# Patient Record
Sex: Female | Born: 1985 | Hispanic: Yes | Marital: Single | Smoking: Former smoker
Health system: Southern US, Community
[De-identification: ages and names within clinical notes are randomized; demographics above are authoritative.]

## PROBLEM LIST (undated history)

## (undated) DIAGNOSIS — F419 Anxiety disorder, unspecified: Secondary | ICD-10-CM

## (undated) DIAGNOSIS — E119 Type 2 diabetes mellitus without complications: Secondary | ICD-10-CM

## (undated) DIAGNOSIS — F329 Major depressive disorder, single episode, unspecified: Secondary | ICD-10-CM

## (undated) DIAGNOSIS — N390 Urinary tract infection, site not specified: Secondary | ICD-10-CM

## (undated) DIAGNOSIS — N870 Mild cervical dysplasia: Secondary | ICD-10-CM

## (undated) DIAGNOSIS — R55 Syncope and collapse: Secondary | ICD-10-CM

## (undated) DIAGNOSIS — F32A Depression, unspecified: Secondary | ICD-10-CM

## (undated) HISTORY — PX: WISDOM TOOTH EXTRACTION: SHX21

## (undated) HISTORY — DX: Syncope and collapse: R55

## (undated) HISTORY — DX: Major depressive disorder, single episode, unspecified: F32.9

## (undated) HISTORY — DX: Depression, unspecified: F32.A

## (undated) HISTORY — DX: Anxiety disorder, unspecified: F41.9

## (undated) HISTORY — PX: NO PAST SURGERIES: SHX2092

## (undated) HISTORY — DX: Mild cervical dysplasia: N87.0

---

## 2011-05-10 ENCOUNTER — Ambulatory Visit (HOSPITAL_COMMUNITY)
Admission: RE | Admit: 2011-05-10 | Discharge: 2011-05-10 | Disposition: A | Discharge status: Home / Self Care | Payer: 59 | Attending: Psychiatry | Admitting: Psychiatry

## 2011-05-10 DIAGNOSIS — F321 Major depressive disorder, single episode, moderate: Secondary | ICD-10-CM | POA: Insufficient documentation

## 2011-05-15 ENCOUNTER — Other Ambulatory Visit (HOSPITAL_COMMUNITY): Payer: 59 | Admitting: Psychiatry

## 2011-05-16 ENCOUNTER — Other Ambulatory Visit (HOSPITAL_COMMUNITY): Payer: 59 | Attending: Psychiatry | Admitting: Psychiatry

## 2011-05-16 DIAGNOSIS — F321 Major depressive disorder, single episode, moderate: Secondary | ICD-10-CM | POA: Insufficient documentation

## 2011-05-16 DIAGNOSIS — R7309 Other abnormal glucose: Secondary | ICD-10-CM | POA: Insufficient documentation

## 2011-05-17 ENCOUNTER — Other Ambulatory Visit (HOSPITAL_COMMUNITY): Payer: 59 | Admitting: Psychiatry

## 2011-05-18 ENCOUNTER — Other Ambulatory Visit (HOSPITAL_COMMUNITY): Payer: 59 | Attending: Psychiatry | Admitting: Psychiatry

## 2011-05-18 DIAGNOSIS — F1220 Cannabis dependence, uncomplicated: Secondary | ICD-10-CM

## 2011-05-18 DIAGNOSIS — R7309 Other abnormal glucose: Secondary | ICD-10-CM | POA: Insufficient documentation

## 2011-05-18 DIAGNOSIS — F429 Obsessive-compulsive disorder, unspecified: Secondary | ICD-10-CM

## 2011-05-18 DIAGNOSIS — F321 Major depressive disorder, single episode, moderate: Secondary | ICD-10-CM | POA: Insufficient documentation

## 2011-05-18 DIAGNOSIS — F332 Major depressive disorder, recurrent severe without psychotic features: Secondary | ICD-10-CM

## 2011-05-19 ENCOUNTER — Other Ambulatory Visit (HOSPITAL_COMMUNITY): Payer: 59 | Admitting: Psychiatry

## 2011-05-22 ENCOUNTER — Other Ambulatory Visit (HOSPITAL_COMMUNITY): Payer: 59 | Admitting: Psychiatry

## 2011-05-23 ENCOUNTER — Other Ambulatory Visit (HOSPITAL_COMMUNITY): Payer: 59 | Admitting: Psychiatry

## 2011-05-24 ENCOUNTER — Other Ambulatory Visit (HOSPITAL_COMMUNITY): Payer: 59 | Admitting: Psychiatry

## 2011-05-25 ENCOUNTER — Other Ambulatory Visit (HOSPITAL_COMMUNITY): Payer: 59 | Attending: Psychiatry | Admitting: Psychiatry

## 2011-05-25 ENCOUNTER — Other Ambulatory Visit (HOSPITAL_COMMUNITY): Payer: 59 | Admitting: Psychiatry

## 2011-05-26 ENCOUNTER — Other Ambulatory Visit (HOSPITAL_COMMUNITY): Payer: 59 | Admitting: Psychiatry

## 2011-05-29 ENCOUNTER — Other Ambulatory Visit (HOSPITAL_COMMUNITY): Payer: 59 | Admitting: Psychiatry

## 2011-05-30 ENCOUNTER — Other Ambulatory Visit (HOSPITAL_COMMUNITY): Payer: 59 | Admitting: Psychiatry

## 2011-05-31 ENCOUNTER — Other Ambulatory Visit (HOSPITAL_COMMUNITY): Payer: 59 | Admitting: Psychiatry

## 2011-06-01 ENCOUNTER — Other Ambulatory Visit (HOSPITAL_COMMUNITY): Payer: 59 | Attending: Psychiatry | Admitting: Psychiatry

## 2011-06-01 DIAGNOSIS — F321 Major depressive disorder, single episode, moderate: Secondary | ICD-10-CM | POA: Insufficient documentation

## 2011-06-01 DIAGNOSIS — R7309 Other abnormal glucose: Secondary | ICD-10-CM | POA: Insufficient documentation

## 2011-06-02 ENCOUNTER — Other Ambulatory Visit (HOSPITAL_COMMUNITY): Payer: 59 | Admitting: Psychiatry

## 2011-06-05 ENCOUNTER — Other Ambulatory Visit (HOSPITAL_COMMUNITY): Payer: 59 | Attending: Psychiatry | Admitting: Psychiatry

## 2011-06-05 ENCOUNTER — Other Ambulatory Visit (HOSPITAL_COMMUNITY): Payer: 59 | Admitting: Psychiatry

## 2011-06-05 DIAGNOSIS — Z6379 Other stressful life events affecting family and household: Secondary | ICD-10-CM | POA: Insufficient documentation

## 2011-06-05 DIAGNOSIS — E669 Obesity, unspecified: Secondary | ICD-10-CM | POA: Insufficient documentation

## 2011-06-05 DIAGNOSIS — F429 Obsessive-compulsive disorder, unspecified: Secondary | ICD-10-CM | POA: Insufficient documentation

## 2011-06-05 DIAGNOSIS — F332 Major depressive disorder, recurrent severe without psychotic features: Secondary | ICD-10-CM | POA: Insufficient documentation

## 2011-06-05 DIAGNOSIS — R7309 Other abnormal glucose: Secondary | ICD-10-CM | POA: Insufficient documentation

## 2011-06-05 DIAGNOSIS — F1220 Cannabis dependence, uncomplicated: Secondary | ICD-10-CM | POA: Insufficient documentation

## 2011-06-06 ENCOUNTER — Other Ambulatory Visit (HOSPITAL_COMMUNITY): Payer: 59 | Attending: Psychiatry | Admitting: Psychiatry

## 2011-06-06 ENCOUNTER — Other Ambulatory Visit (HOSPITAL_COMMUNITY): Payer: 59 | Admitting: Psychiatry

## 2011-06-06 DIAGNOSIS — E669 Obesity, unspecified: Secondary | ICD-10-CM | POA: Insufficient documentation

## 2011-06-06 DIAGNOSIS — F429 Obsessive-compulsive disorder, unspecified: Secondary | ICD-10-CM | POA: Insufficient documentation

## 2011-06-06 DIAGNOSIS — R7309 Other abnormal glucose: Secondary | ICD-10-CM | POA: Insufficient documentation

## 2011-06-06 DIAGNOSIS — F1220 Cannabis dependence, uncomplicated: Secondary | ICD-10-CM | POA: Insufficient documentation

## 2011-06-06 DIAGNOSIS — F332 Major depressive disorder, recurrent severe without psychotic features: Secondary | ICD-10-CM | POA: Insufficient documentation

## 2011-06-08 ENCOUNTER — Other Ambulatory Visit (HOSPITAL_COMMUNITY): Payer: 59 | Attending: Psychiatry | Admitting: Psychiatry

## 2011-06-08 DIAGNOSIS — F429 Obsessive-compulsive disorder, unspecified: Secondary | ICD-10-CM | POA: Insufficient documentation

## 2011-06-08 DIAGNOSIS — F1220 Cannabis dependence, uncomplicated: Secondary | ICD-10-CM | POA: Insufficient documentation

## 2011-06-08 DIAGNOSIS — F332 Major depressive disorder, recurrent severe without psychotic features: Secondary | ICD-10-CM | POA: Insufficient documentation

## 2011-06-08 DIAGNOSIS — E669 Obesity, unspecified: Secondary | ICD-10-CM | POA: Insufficient documentation

## 2011-06-09 ENCOUNTER — Other Ambulatory Visit (HOSPITAL_COMMUNITY): Payer: 59 | Admitting: Psychiatry

## 2011-06-12 ENCOUNTER — Other Ambulatory Visit (HOSPITAL_COMMUNITY): Payer: 59 | Admitting: Psychiatry

## 2011-06-13 ENCOUNTER — Other Ambulatory Visit (HOSPITAL_COMMUNITY): Payer: 59 | Admitting: Psychiatry

## 2011-06-14 ENCOUNTER — Other Ambulatory Visit (HOSPITAL_COMMUNITY): Payer: 59 | Admitting: Psychiatry

## 2011-06-15 ENCOUNTER — Other Ambulatory Visit (HOSPITAL_COMMUNITY): Payer: 59 | Admitting: Psychiatry

## 2011-06-16 ENCOUNTER — Other Ambulatory Visit (HOSPITAL_COMMUNITY): Payer: 59 | Admitting: Psychiatry

## 2011-06-16 ENCOUNTER — Ambulatory Visit (HOSPITAL_COMMUNITY): Payer: 59 | Admitting: Psychology

## 2011-06-19 ENCOUNTER — Other Ambulatory Visit (HOSPITAL_COMMUNITY): Payer: 59 | Admitting: Psychiatry

## 2012-02-22 ENCOUNTER — Ambulatory Visit (INDEPENDENT_AMBULATORY_CARE_PROVIDER_SITE_OTHER): Payer: Self-pay | Admitting: Gynecology

## 2012-02-22 ENCOUNTER — Encounter: Payer: Self-pay | Admitting: Gynecology

## 2012-02-22 VITALS — BP 110/70 | Ht 63.0 in | Wt 150.0 lb

## 2012-02-22 DIAGNOSIS — Z309 Encounter for contraceptive management, unspecified: Secondary | ICD-10-CM

## 2012-02-22 DIAGNOSIS — Z30432 Encounter for removal of intrauterine contraceptive device: Secondary | ICD-10-CM

## 2012-02-22 DIAGNOSIS — R102 Pelvic and perineal pain: Secondary | ICD-10-CM

## 2012-02-22 DIAGNOSIS — N949 Unspecified condition associated with female genital organs and menstrual cycle: Secondary | ICD-10-CM

## 2012-02-22 NOTE — Progress Notes (Signed)
Pa half for tient is a 26 year old gravida 0 who presented to the office as a new patient today with a request to have her Mirena IUD removed. She stated that since her IUD was placed in another facility in 2011 she has constant left lower quadrant pain. Other facility had recommended over-the-counter nonsteroidals and it has not alleviated her discomfort. She also wanted to discuss other forms of contraception. She had been on the pill prior to the Mirena IUD but the reason for switching over was an issue of remembering to take a tablet every day. She denies any family history of any thrombophilia. She stated her last Pap smear was normal a year ago.  Exam: Bartholin urethra Skene was within normal limits Vagina: No lesions or discharge Cervix: IUD string seen Uterus: Slightly retroverted no palpable masses or tenderness Adnexa: No palpable masses or tenderness Rectal exam: Not done  As per patient's request the IUD was removed in a sterile fashion shown to the patient and discarded. We discussed different alternative forms of contraception and she is interested in proceeding with a Nuvaring and instructions and demonstration as well as a sample was provided. If she continues with the right lower quadrant discomfort she will return back to the office and we'll do an ultrasound and further evaluation. She'll return back in 6 months which is due for full annual GYN exam. New screening Pap smear guidelines were discussed.

## 2012-02-22 NOTE — Patient Instructions (Addendum)

## 2013-11-04 ENCOUNTER — Ambulatory Visit: Payer: Self-pay | Admitting: Family Medicine

## 2013-11-04 VITALS — BP 100/72 | HR 70 | Temp 98.7°F | Resp 16 | Ht 63.5 in | Wt 150.8 lb

## 2013-11-04 DIAGNOSIS — R350 Frequency of micturition: Secondary | ICD-10-CM

## 2013-11-04 DIAGNOSIS — R319 Hematuria, unspecified: Secondary | ICD-10-CM

## 2013-11-04 LAB — POCT UA - MICROSCOPIC ONLY
Casts, Ur, LPF, POC: NEGATIVE
Mucus, UA: POSITIVE
Yeast, UA: NEGATIVE

## 2013-11-04 LAB — POCT URINALYSIS DIPSTICK
Bilirubin, UA: NEGATIVE
Nitrite, UA: NEGATIVE
Spec Grav, UA: 1.025
pH, UA: 8

## 2013-11-04 MED ORDER — PHENAZOPYRIDINE HCL 200 MG PO TABS: 200.0000 mg | ORAL_TABLET | Freq: Three times a day (TID) | ORAL | Status: DC | PRN

## 2013-11-04 MED ORDER — CIPROFLOXACIN HCL 500 MG PO TABS: 500.0000 mg | ORAL_TABLET | Freq: Two times a day (BID) | ORAL | Status: DC

## 2013-11-04 NOTE — Patient Instructions (Signed)
Urinary Tract Infection  Urinary tract infections (UTIs) can develop anywhere along your urinary tract. Your urinary tract is your body's drainage system for removing wastes and extra water. Your urinary tract includes two kidneys, two ureters, a bladder, and a urethra. Your kidneys are a pair of bean-shaped organs. Each kidney is about the size of your fist. They are located below your ribs, one on each side of your spine.  CAUSES  Infections are caused by microbes, which are microscopic organisms, including fungi, viruses, and bacteria. These organisms are so small that they can only be seen through a microscope. Bacteria are the microbes that most commonly cause UTIs.  SYMPTOMS   Symptoms of UTIs may vary by age and gender of the patient and by the location of the infection. Symptoms in young women typically include a frequent and intense urge to urinate and a painful, burning feeling in the bladder or urethra during urination. Older women and men are more likely to be tired, shaky, and weak and have muscle aches and abdominal pain. A fever may mean the infection is in your kidneys. Other symptoms of a kidney infection include pain in your back or sides below the ribs, nausea, and vomiting.  DIAGNOSIS  To diagnose a UTI, your caregiver will ask you about your symptoms. Your caregiver also will ask to provide a urine sample. The urine sample will be tested for bacteria and white blood cells. White blood cells are made by your body to help fight infection.  TREATMENT   Typically, UTIs can be treated with medication. Because most UTIs are caused by a bacterial infection, they usually can be treated with the use of antibiotics. The choice of antibiotic and length of treatment depend on your symptoms and the type of bacteria causing your infection.  HOME CARE INSTRUCTIONS   If you were prescribed antibiotics, take them exactly as your caregiver instructs you. Finish the medication even if you feel better after you  have only taken some of the medication.   Drink enough water and fluids to keep your urine clear or pale yellow.   Avoid caffeine, tea, and carbonated beverages. They tend to irritate your bladder.   Empty your bladder often. Avoid holding urine for long periods of time.   Empty your bladder before and after sexual intercourse.   After a bowel movement, women should cleanse from front to back. Use each tissue only once.  SEEK MEDICAL CARE IF:    You have back pain.   You develop a fever.   Your symptoms do not begin to resolve within 3 days.  SEEK IMMEDIATE MEDICAL CARE IF:    You have severe back pain or lower abdominal pain.   You develop chills.   You have nausea or vomiting.   You have continued burning or discomfort with urination.  MAKE SURE YOU:    Understand these instructions.   Will watch your condition.   Will get help right away if you are not doing well or get worse.  Document Released: 08/30/2005 Document Revised: 05/21/2012 Document Reviewed: 12/29/2011  ExitCare Patient Information 2014 [employer].

## 2013-11-04 NOTE — Progress Notes (Signed)
Subjective:    Patient ID: Sherry Campbell, female    DOB: November 08, 1986, 27 y.o.   MRN: [id number] This chart was scribed for Sherry Sidle, MD by Clydene Laming, ED Scribe. This patient was seen in room 4 and the patient's care was started at 5:55 PM. HPI  HPI Comments: Baylea Milburn is a 27 y.o. female who presents to the [facility] complaining of hematuria with associated abdominal pain and back pain beginning  She reports urgency with a decreased amount with pink spots. Pt denies fever nausea vomiting. Pt denies a hx of kidney stones, kidney infection, and uti's. Pt denies being pregnant but states it could be possible. Her lmc was 11/9-11/14. 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     There are no active problems to display for this patient.  Past Medical History  Diagnosis Date   Anxiety    Depression    History reviewed. No pertinent past surgical history. No Known Allergies Prior to Admission medications   Not on File   History   Social History   Marital Status: Single    Spouse Name: N/A    Number of Children: N/A   Years of Education: N/A   Occupational History   Not on file.   Social History Main Topics   Smoking status: Never Smoker    Smokeless tobacco: Never Used   Alcohol Use: Yes     Comment: SOCIALLY   Drug Use: No   Sexual Activity: Yes    Birth Control/ Protection: IUD   Other Topics Concern   Not on file   Social History Narrative   No narrative on file     Review of Systems  Constitutional: Negative for fever and chills.  Gastrointestinal: Positive for abdominal pain. Negative for nausea and vomiting.  Genitourinary: Positive for urgency, hematuria and decreased urine volume.  Musculoskeletal: Positive for back pain.       Objective:   Physical Exam  Nursing note and vitals reviewed. Constitutional: She is oriented to person, place, and time. She appears  well-developed and well-nourished.  HENT:  Head: Normocephalic.  Eyes: EOM are normal.  Neck: Normal range of motion.  Pulmonary/Chest: Effort normal.  Musculoskeletal: Normal range of motion.  Neurological: She is alert and oriented to person, place, and time.  Psychiatric: She has a normal mood and affect.   Filed Vitals:   11/04/13 1741  BP: 100/72  Pulse: 70  Temp: 98.7 F (37.1 C)  TempSrc: Oral  Resp: 16  Height: 5' 3.5" (1.613 m)  Weight: 150 lb 12.8 oz (68.402 kg)  SpO2: 100%  lungs clear No CVAT Results for orders placed in visit on 11/04/13  POCT UA - MICROSCOPIC ONLY      Result Value Range   WBC, Ur, HPF, POC tntc     RBC, urine, microscopic tntc     Bacteria, U Microscopic 0-2     Mucus, UA pos     Epithelial cells, urine per micros 0-2     Crystals, Ur, HPF, POC neg     Casts, Ur, LPF, POC neg     Yeast, UA neg    POCT URINALYSIS DIPSTICK      Result Value Range   Color, UA yellow     Clarity, UA clear     Glucose, UA neg     Bilirubin, UA neg     Ketones, UA  neg     Spec Grav, UA 1.025     Blood, UA large     pH, UA 8.0     Protein, UA 100     Urobilinogen, UA 0.2     Nitrite, UA neg     Leukocytes, UA small (1+)    POCT URINE PREGNANCY      Result Value Range   Preg Test, Ur Negative           Assessment & Plan:  5:58 PM- Discussed treatment plan with pt at bedside. Pt verbalized understanding and agreement with plan.  I personally performed the services described in this documentation, which was scribed in my presence. The recorded information has been reviewed and is accurate. Hematuria - Plan: POCT UA - Microscopic Only, POCT urinalysis dipstick, POCT urine pregnancy, ciprofloxacin (CIPRO) 500 MG tablet, phenazopyridine (PYRIDIUM) 200 MG tablet  Frequent urination - Plan: POCT UA - Microscopic Only, POCT urinalysis dipstick, POCT urine pregnancy, ciprofloxacin (CIPRO) 500 MG tablet, phenazopyridine (PYRIDIUM) 200 MG  tablet  Signed, Sherry Sidle, MD

## 2013-11-05 ENCOUNTER — Telehealth: Payer: Self-pay

## 2013-11-05 NOTE — Telephone Encounter (Signed)
Patient states that her Ciprofloxacin and Pyridium are making her dizzy. Is this normal?  [phone]

## 2013-11-06 MED ORDER — NITROFURANTOIN MONOHYD MACRO 100 MG PO CAPS: 100.0000 mg | ORAL_CAPSULE | Freq: Two times a day (BID) | ORAL | Status: DC

## 2013-11-06 NOTE — Telephone Encounter (Signed)
Called her to advise she does want to change this, she will d/c the meds, she notes she has improved slightly

## 2013-11-06 NOTE — Telephone Encounter (Signed)
Called and spoke with patient in regards to new med being sent in.

## 2013-11-06 NOTE — Telephone Encounter (Signed)
Please advise 

## 2013-11-06 NOTE — Telephone Encounter (Signed)
Cipro can cause dizziness.  If she cannot tolerate this, we can change her to a different antibiotic

## 2013-11-06 NOTE — Telephone Encounter (Signed)
Sent macrobid to pharmacy.  Finish full course

## 2014-12-29 ENCOUNTER — Ambulatory Visit: Payer: Self-pay | Admitting: Family Medicine

## 2014-12-31 ENCOUNTER — Ambulatory Visit (INDEPENDENT_AMBULATORY_CARE_PROVIDER_SITE_OTHER): Payer: 59 | Admitting: Emergency Medicine

## 2014-12-31 VITALS — BP 118/70 | HR 84 | Temp 98.9°F | Resp 18 | Ht 63.5 in | Wt 159.0 lb

## 2014-12-31 DIAGNOSIS — B9689 Other specified bacterial agents as the cause of diseases classified elsewhere: Secondary | ICD-10-CM

## 2014-12-31 DIAGNOSIS — A5901 Trichomonal vulvovaginitis: Secondary | ICD-10-CM

## 2014-12-31 DIAGNOSIS — N912 Amenorrhea, unspecified: Secondary | ICD-10-CM

## 2014-12-31 DIAGNOSIS — A499 Bacterial infection, unspecified: Secondary | ICD-10-CM

## 2014-12-31 DIAGNOSIS — N760 Acute vaginitis: Secondary | ICD-10-CM

## 2014-12-31 DIAGNOSIS — R109 Unspecified abdominal pain: Secondary | ICD-10-CM

## 2014-12-31 LAB — POCT WET PREP WITH KOH
KOH Prep POC: NEGATIVE
Trichomonas, UA: POSITIVE
YEAST WET PREP PER HPF POC: NEGATIVE

## 2014-12-31 LAB — POCT URINALYSIS DIPSTICK
Bilirubin, UA: NEGATIVE
Blood, UA: NEGATIVE
GLUCOSE UA: NEGATIVE
KETONES UA: NEGATIVE
NITRITE UA: NEGATIVE
PH UA: 6.5
Protein, UA: NEGATIVE
Spec Grav, UA: 1.025
Urobilinogen, UA: 0.2

## 2014-12-31 LAB — POCT UA - MICROSCOPIC ONLY
CASTS, UR, LPF, POC: NEGATIVE
CRYSTALS, UR, HPF, POC: NEGATIVE
MUCUS UA: POSITIVE
Trichomonas: POSITIVE
Yeast, UA: NEGATIVE

## 2014-12-31 LAB — POCT URINE PREGNANCY: PREG TEST UR: NEGATIVE

## 2014-12-31 MED ORDER — METRONIDAZOLE 500 MG PO TABS: 500.0000 mg | ORAL_TABLET | Freq: Two times a day (BID) | ORAL | Status: DC

## 2014-12-31 NOTE — Patient Instructions (Signed)
Bacterial Vaginosis °Bacterial vaginosis is a vaginal infection that occurs when the normal balance of bacteria in the vagina is disrupted. It results from an overgrowth of certain bacteria. This is the most common vaginal infection in women of childbearing age. Treatment is important to prevent complications, especially in pregnant women, as it can cause a premature delivery. °CAUSES  °Bacterial vaginosis is caused by an increase in harmful bacteria that are normally present in smaller amounts in the vagina. Several different kinds of bacteria can cause bacterial vaginosis. However, the reason that the condition develops is not fully understood. °RISK FACTORS °Certain activities or behaviors can put you at an increased risk of developing bacterial vaginosis, including: °· Having a new sex partner or multiple sex partners. °· Douching. °· Using an intrauterine device (IUD) for contraception. °Women do not get bacterial vaginosis from toilet seats, bedding, swimming pools, or contact with objects around them. °SIGNS AND SYMPTOMS  °Some women with bacterial vaginosis have no signs or symptoms. Common symptoms include: °· Grey vaginal discharge. °· A fishlike odor with discharge, especially after sexual intercourse. °· Itching or burning of the vagina and vulva. °· Burning or pain with urination. °DIAGNOSIS  °Your health care provider will take a medical history and examine the vagina for signs of bacterial vaginosis. A sample of vaginal fluid may be taken. Your health care provider will look at this sample under a microscope to check for bacteria and abnormal cells. A vaginal pH test may also be done.  °TREATMENT  °Bacterial vaginosis may be treated with antibiotic medicines. These may be given in the form of a pill or a vaginal cream. A second round of antibiotics may be prescribed if the condition comes back after treatment.  °HOME CARE INSTRUCTIONS  °· Only take over-the-counter or prescription medicines as  directed by your health care provider. °· If antibiotic medicine was prescribed, take it as directed. Make sure you finish it even if you start to feel better. °· Do not have sex until treatment is completed. °· Tell all sexual partners that you have a vaginal infection. They should see their health care provider and be treated if they have problems, such as a mild rash or itching. °· Practice safe sex by using condoms and only having one sex partner. °SEEK MEDICAL CARE IF:  °· Your symptoms are not improving after 3 days of treatment. °· You have increased discharge or pain. °· You have a fever. °MAKE SURE YOU:  °· Understand these instructions. °· Will watch your condition. °· Will get help right away if you are not doing well or get worse. °FOR MORE INFORMATION  °Centers for Disease Control and Prevention, Division of STD Prevention: www.cdc.gov/std °American Sexual Health Association (ASHA): www.ashastd.org  °Document Released: 11/20/2005 Document Revised: 09/10/2013 Document Reviewed: 07/02/2013 °ExitCare® Patient Information ©2015 [employer]. This information is not intended to replace advice given to you by your health care provider. Make sure you discuss any questions you have with your health care provider. °Trichomoniasis °Trichomoniasis is an infection caused by an organism called Trichomonas. The infection can affect both women and men. In women, the outer female genitalia and the vagina are affected. In men, the penis is mainly affected, but the prostate and other reproductive organs can also be involved. Trichomoniasis is a sexually transmitted infection (STI) and is most often passed to another person through sexual contact.  °RISK FACTORS °· Having unprotected sexual intercourse. °· Having sexual intercourse with an infected partner. °SIGNS AND SYMPTOMS  °  Symptoms of trichomoniasis in women include: °· Abnormal gray-green frothy vaginal discharge. °· Itching and irritation of the  vagina. °· Itching and irritation of the area outside the vagina. °Symptoms of trichomoniasis in men include:  °· Penile discharge with or without pain. °· Pain during urination. This results from inflammation of the urethra. °DIAGNOSIS  °Trichomoniasis may be found during a Pap test or physical exam. Your health care provider may use one of the following methods to help diagnose this infection: °· Examining vaginal discharge under a microscope. For men, urethral discharge would be examined. °· Testing the pH of the vagina with a test tape. °· Using a vaginal swab test that checks for the Trichomonas organism. A test is available that provides results within a few minutes. °· Doing a culture test for the organism. This is not usually needed. °TREATMENT  °· You may be given medicine to fight the infection. Women should inform their health care provider if they could be or are pregnant. Some medicines used to treat the infection should not be taken during pregnancy. °· Your health care provider may recommend over-the-counter medicines or creams to decrease itching or irritation. °· Your sexual partner will need to be treated if infected. °HOME CARE INSTRUCTIONS  °· Take medicines only as directed by your health care provider. °· Take over-the-counter medicine for itching or irritation as directed by your health care provider. °· Do not have sexual intercourse while you have the infection. °· Women should not douche or wear tampons while they have the infection. °· Discuss your infection with your partner. Your partner may have gotten the infection from you, or you may have gotten it from your partner. °· Have your sex partner get examined and treated if necessary. °· Practice safe, informed, and protected sex. °· See your health care provider for other STI testing. °SEEK MEDICAL CARE IF:  °· You still have symptoms after you finish your medicine. °· You develop abdominal pain. °· You have pain when you urinate. °· You  have bleeding after sexual intercourse. °· You develop a rash. °· Your medicine makes you sick or makes you throw up (vomit). °MAKE SURE YOU: °· Understand these instructions. °· Will watch your condition. °· Will get help right away if you are not doing well or get worse. °Document Released: 05/16/2001 Document Revised: 04/06/2014 Document Reviewed: 09/01/2013 °ExitCare® Patient Information ©2015 [employer]. This information is not intended to replace advice given to you by your health care provider. Make sure you discuss any questions you have with your health care provider. ° °

## 2014-12-31 NOTE — Progress Notes (Signed)
[facility] [address] [place] [address] [phone]  Date:  12/31/2014   Name:  Sherry Campbell   DOB:  06/15/1986   MRN:  [id number]  PCP:  No primary care provider on file.    Chief Complaint: Flank Pain; late mense; and dm check   History of Present Illness:  Sherry Campbell is a 29 y.o. very pleasant female patient who presents with the following:  Last menses christmas.  Missed January.  Sexually active with no BC Negative HPT x1 Has one week duration left groin pain.  Pain is worse with walking but has diminished. No dysuria urgency or frequency.  No nausea or vomiting. No nausea or vomiting or diarrhea.  No stool change No fever or chills. No improvement with over the counter medications or other home remedies.  Denies other complaint or health concern today.    There are no active problems to display for this patient.   Past Medical History  Diagnosis Date  . Anxiety   . Depression     History reviewed. No pertinent past surgical history.  History  Substance Use Topics  . Smoking status: Never Smoker   . Smokeless tobacco: Never Used  . Alcohol Use: Yes     Comment: SOCIALLY    Family History  Problem Relation Age of Onset  . Diabetes Mother   . Hypertension Mother   . Diabetes Father   . Diabetes Maternal Grandmother   . Diabetes Maternal Grandfather   . Heart disease Maternal Grandfather   . Diabetes Paternal Grandmother   . Diabetes Paternal Grandfather     No Known Allergies  Medication list has been reviewed and updated.  Current Outpatient Prescriptions on File Prior to Visit  Medication Sig Dispense Refill  . nitrofurantoin, macrocrystal-monohydrate, (MACROBID) 100 MG capsule Take 1 capsule (100 mg total) by mouth 2 (two) times daily. (Patient not taking: Reported on 12/31/2014) 10 capsule 0  . phenazopyridine (PYRIDIUM) 200 MG tablet Take 1 tablet (200 mg total) by mouth 3 (three) times daily as needed for pain.  (Patient not taking: Reported on 12/31/2014) 10 tablet 0   No current facility-administered medications on file prior to visit.    Review of Systems:  As per HPI, otherwise negative.    Physical Examination: Filed Vitals:   12/31/14 0858  BP: 118/70  Pulse: 84  Temp: 98.9 F (37.2 C)  Resp: 18   Filed Vitals:   12/31/14 0858  Height: 5' 3.5" (1.613 m)  Weight: 159 lb (72.122 kg)   Body mass index is 27.72 kg/(m^2). Ideal Body Weight: Weight in (lb) to have BMI = 25: 143.1   GEN: WDWN, NAD, Non-toxic, Alert & Oriented x 3 HEENT: Atraumatic, Normocephalic.  Ears and Nose: No external deformity. EXTR: No clubbing/cyanosis/edema NEURO: Normal gait.  PSYCH: Normally interactive. Conversant. Not depressed or anxious appearing.  Calm demeanor.  Abd:  Mild LLQ tenderness.  No rebound or guarding.  No CVA tenderness  Assessment and Plan: BV Trich Flagyl Amenorrhea  Signed,  Phillips Odor, MD    Results for orders placed or performed in visit on 12/31/14  POCT urine pregnancy  Result Value Ref Range   Preg Test, Ur Negative   POCT urinalysis dipstick  Result Value Ref Range   Color, UA dk yellow    Clarity, UA cloudy    Glucose, UA neg    Bilirubin, UA neg    Ketones, UA neg    Spec Grav, UA  1.025    Blood, UA neg    pH, UA 6.5    Protein, UA neg    Urobilinogen, UA 0.2    Nitrite, UA neg    Leukocytes, UA Trace   POCT UA - Microscopic Only  Result Value Ref Range   WBC, Ur, HPF, POC 1-5    RBC, urine, microscopic 0-2    Bacteria, U Microscopic 4+    Mucus, UA pos    Epithelial cells, urine per micros 0-6    Crystals, Ur, HPF, POC neg    Casts, Ur, LPF, POC neg    Yeast, UA neg    Trichomonas pos   POCT Wet Prep with KOH  Result Value Ref Range   Trichomonas, UA Positive    Clue Cells Wet Prep HPF POC 1-4    Epithelial Wet Prep HPF POC 1-6    Yeast Wet Prep HPF POC neg    Bacteria Wet Prep HPF POC 3+    RBC Wet Prep HPF POC 0-1    WBC Wet  Prep HPF POC 1-7    KOH Prep POC Negative

## 2015-01-02 ENCOUNTER — Telehealth: Payer: Self-pay

## 2015-01-02 NOTE — Telephone Encounter (Signed)
PATIENT STATES SHE SAW DR. Stevan BornANDERSON THURS. FOR A VAGINAL INFECTION. HE PRESCRIBED HER METRONIDAZOLE 500 MG. WHEN SHE PICKED IT UP AT THE PHARMACY TODAY, SHE THOUGHT HE WAS GOING TO GIVE HER 2 MEDICATIONS, BUT SHE ONLY HAD ONE. SHE WANTS TO MAKE SURE THIS IS CORRECT? BEST PHONE [phone] (CELL)  PHARMACY CHOICE IS [facility] ON [address]   MBC

## 2015-01-04 NOTE — Telephone Encounter (Signed)
Spoke with pt, she was diagnosed with BV and Trich but I advised her Flagyl is used to treat both. She did not need any other medication. Pt understood.

## 2015-01-11 ENCOUNTER — Telehealth: Payer: Self-pay

## 2015-01-11 NOTE — Telephone Encounter (Signed)
Spoke with pt, advised she should wait a week until she can resume sex. Pt understood.

## 2015-01-11 NOTE — Telephone Encounter (Signed)
Pt is taking metroNIDAZOLE (FLAGYL) 500 MG tablet [[id number]][[id number]] , her last pill is tonight. She would like to know when she can resume her lifestyle. [phone]

## 2015-01-22 ENCOUNTER — Ambulatory Visit (INDEPENDENT_AMBULATORY_CARE_PROVIDER_SITE_OTHER): Payer: 59 | Admitting: Women's Health

## 2015-01-22 ENCOUNTER — Other Ambulatory Visit (HOSPITAL_COMMUNITY)
Admission: RE | Admit: 2015-01-22 | Discharge: 2015-01-22 | Disposition: A | Discharge status: Home / Self Care | Payer: 59 | Source: Ambulatory Visit | Attending: Gynecology | Admitting: Gynecology

## 2015-01-22 ENCOUNTER — Encounter: Payer: Self-pay | Admitting: Women's Health

## 2015-01-22 VITALS — BP 120/80 | Ht 62.0 in | Wt 161.0 lb

## 2015-01-22 DIAGNOSIS — Z01411 Encounter for gynecological examination (general) (routine) with abnormal findings: Secondary | ICD-10-CM | POA: Diagnosis not present

## 2015-01-22 DIAGNOSIS — N898 Other specified noninflammatory disorders of vagina: Secondary | ICD-10-CM

## 2015-01-22 DIAGNOSIS — N760 Acute vaginitis: Secondary | ICD-10-CM

## 2015-01-22 DIAGNOSIS — A499 Bacterial infection, unspecified: Secondary | ICD-10-CM

## 2015-01-22 DIAGNOSIS — Z01419 Encounter for gynecological examination (general) (routine) without abnormal findings: Secondary | ICD-10-CM

## 2015-01-22 DIAGNOSIS — B9689 Other specified bacterial agents as the cause of diseases classified elsewhere: Secondary | ICD-10-CM

## 2015-01-22 DIAGNOSIS — Z13220 Encounter for screening for lipoid disorders: Secondary | ICD-10-CM

## 2015-01-22 DIAGNOSIS — Z113 Encounter for screening for infections with a predominantly sexual mode of transmission: Secondary | ICD-10-CM

## 2015-01-22 DIAGNOSIS — Z833 Family history of diabetes mellitus: Secondary | ICD-10-CM

## 2015-01-22 LAB — CBC WITH DIFFERENTIAL/PLATELET
Basophils Absolute: 0 10*3/uL (ref 0.0–0.1)
Basophils Relative: 0 % (ref 0–1)
EOS PCT: 2 % (ref 0–5)
Eosinophils Absolute: 0.1 10*3/uL (ref 0.0–0.7)
HCT: 36.4 % (ref 36.0–46.0)
HEMOGLOBIN: 12.3 g/dL (ref 12.0–15.0)
LYMPHS PCT: 38 % (ref 12–46)
Lymphs Abs: 2.3 10*3/uL (ref 0.7–4.0)
MCH: 29.6 pg (ref 26.0–34.0)
MCHC: 33.8 g/dL (ref 30.0–36.0)
MCV: 87.7 fL (ref 78.0–100.0)
MPV: 9.5 fL (ref 8.6–12.4)
Monocytes Absolute: 0.4 10*3/uL (ref 0.1–1.0)
Monocytes Relative: 6 % (ref 3–12)
Neutro Abs: 3.2 10*3/uL (ref 1.7–7.7)
Neutrophils Relative %: 54 % (ref 43–77)
Platelets: 295 10*3/uL (ref 150–400)
RBC: 4.15 MIL/uL (ref 3.87–5.11)
RDW: 13.1 % (ref 11.5–15.5)
WBC: 6 10*3/uL (ref 4.0–10.5)

## 2015-01-22 LAB — LIPID PANEL
Cholesterol: 114 mg/dL (ref 0–200)
HDL: 29 mg/dL — AB (ref >39)
LDL CALC: 70 mg/dL (ref 0–99)
Total CHOL/HDL Ratio: 3.9 Ratio
Triglycerides: 76 mg/dL (ref <150)
VLDL: 15 mg/dL (ref 0–40)

## 2015-01-22 LAB — GLUCOSE, RANDOM: GLUCOSE: 82 mg/dL (ref 70–99)

## 2015-01-22 LAB — WET PREP FOR TRICH, YEAST, CLUE
Trich, Wet Prep: NONE SEEN
Yeast Wet Prep HPF POC: NONE SEEN

## 2015-01-22 NOTE — Patient Instructions (Signed)

## 2015-01-22 NOTE — Progress Notes (Signed)
Sherry AlfredBeatriz Campbell [birth year] [id number]    History:    Presents for annual exam.  Regular monthly cycle diagnosed with trichomonas last month at urgent care. Did not have  STD screening. Reports normal Pap in the past. Did not receive gardasil.  Past medical history, past surgical history, family history and social history were all reviewed and documented in the EPIC chart. Works at a [employer]. Numerous family members with diabetes. Mother on dialysis from complications of diabetes.  ROS:  A ROS was performed and pertinent positives and negatives are included.  Exam:  Filed Vitals:   01/22/15 1200  BP: 120/80    General appearance:  Normal Thyroid:  Symmetrical, normal in size, without palpable masses or nodularity. Respiratory  Auscultation:  Clear without wheezing or rhonchi Cardiovascular  Auscultation:  Regular rate, without rubs, murmurs or gallops  Edema/varicosities:  Not grossly evident Abdominal  Soft,nontender, without masses, guarding or rebound.  Liver/spleen:  No organomegaly noted  Hernia:  None appreciated  Skin  Inspection:  Grossly normal   Breasts: Examined lying and sitting.     Right: Without masses, retractions, discharge or axillary adenopathy.     Left: Without masses, retractions, discharge or axillary adenopathy. Gentitourinary   Inguinal/mons:  Normal without inguinal adenopathy  External genitalia:  Normal  BUS/Urethra/Skene's glands:  Normal  Vagina:  Normal wet prep negative.  Cervix:  Normal  Uterus:   normal in size, shape and contour.  Midline and mobile  Adnexa/parametria:     Rt: Without masses or tenderness.   Lt: Without masses or tenderness.  Anus and perineum: Normal  Digital rectal exam: Normal sphincter tone without palpated masses or tenderness  Assessment/Plan:  29 y.o. SHF G0 for annual exam.     Regular monthly cycle Contraception management STD screen  Plan: Contraception options reviewed and declines pregnancy  okay. Reviewed importance of MVI daily, safe pregnancy behaviors reviewed. Return to office with missed cycle for viability ultrasound reviewed we no longer deliver. CBC, glucose, lipid panel, UA, Pap, GC/Chlamydia, HIV, hep B, C, RPR. Reviewed importance of maintaining weight or weight loss, regular exercise, low carb diet for prevention of diabetes.   Harrington ChallengerYOUNG,Cloris Flippo J WHNP, 1:01 PM 01/22/2015

## 2015-01-23 LAB — GC/CHLAMYDIA PROBE AMP
CT Probe RNA: NEGATIVE
GC Probe RNA: NEGATIVE

## 2015-01-23 LAB — HEPATITIS B SURFACE ANTIGEN: Hepatitis B Surface Ag: NEGATIVE

## 2015-01-23 LAB — HEPATITIS C ANTIBODY: HCV AB: NEGATIVE

## 2015-01-23 LAB — RPR

## 2015-01-23 LAB — HIV ANTIBODY (ROUTINE TESTING W REFLEX): HIV 1&2 Ab, 4th Generation: NONREACTIVE

## 2015-01-25 ENCOUNTER — Encounter: Payer: Self-pay | Admitting: Women's Health

## 2015-01-25 NOTE — Telephone Encounter (Signed)
Sherry Campbell, Her wet prep did show rare clue cells but I do not see that you treated her. Was not sure how to explain to patient.

## 2015-01-26 LAB — CYTOLOGY - PAP

## 2015-02-18 ENCOUNTER — Ambulatory Visit: Payer: 59 | Admitting: Gynecology

## 2015-03-05 ENCOUNTER — Ambulatory Visit (INDEPENDENT_AMBULATORY_CARE_PROVIDER_SITE_OTHER): Payer: 59 | Admitting: Gynecology

## 2015-03-05 ENCOUNTER — Encounter: Payer: Self-pay | Admitting: Gynecology

## 2015-03-05 VITALS — BP 120/84 | Ht 62.0 in | Wt 165.0 lb

## 2015-03-05 DIAGNOSIS — N870 Mild cervical dysplasia: Secondary | ICD-10-CM | POA: Diagnosis not present

## 2015-03-05 NOTE — Progress Notes (Addendum)
   Patient is a 29 year old gravida 0 who presented to the office today for further evaluation as a result of her recent Pap smear at time of her annual exam which demonstrated following:  Diagnosis LOW GRADE SQUAMOUS INTRAEPITHELIAL LESION: CIN-1/ HPV (LSIL).  Patient stated that several years ago she had some form of abnormal Pap smear but was followed up with no biopsy or treatment and it appeared to have cleared up. We have no documentation.    Patient currently not using any form of contraception. Her last menstrual. Was 2 weeks ago. She is here for colposcopic evaluation.  Patient underwent detail colposcopic evaluation external genitalia, perineum, perirectal region with no lesions seen. The speculum was then introduced into the vagina. Systematic inspection of the entire vaginal walls as well as the fornix and cervix was undertaken. An acetowhite flat area was noted between 12 and 2:00 position with no atypical vessels or any cobblestoning effect. The use of the endocervical speculum demonstrated that the transformation zone was completely visualized. 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 Colposcopic directed biopsy of ectocervical lesion as noted above we will await pathology report and manage her according to the new guidelines.. I have explained to the patient the new ASCCP guidelines and if the Pap smear coincides with her pathology that the recommendation would be to repeat a Pap smear with HPV screen in 12 months. Patient would like to hold off on attempting to get pregnant until later in the year and will return back when her menses start for the first of series of 3 injections for the HPV vaccine. She is fully aware that she  will need to use condoms during that time. Literature information was provided.

## 2015-03-05 NOTE — Addendum Note (Signed)
Addended by: Kem ParkinsonBARNES, Kimya Mccahill on: 03/05/2015 09:49 AM   Modules accepted: Orders

## 2015-03-05 NOTE — Patient Instructions (Signed)
Colposcopy  Care After  Colposcopy is a procedure in which a special tool is used to magnify the surface of the cervix. A tissue sample (biopsy) may also be taken. This sample will be looked at for cervical cancer or other problems. After the test:  · You may have some cramping.  · Lie down for a few minutes if you feel lightheaded.  ·  You may have some bleeding which should stop in a few days.  HOME CARE  · Do not have sex or use tampons for 2 to 3 days or as told.  · Only take medicine as told by your doctor.  · Continue to take your birth control pills as usual.  Finding out the results of your test  Ask when your test results will be ready. Make sure you get your test results.  GET HELP RIGHT AWAY IF:  · You are bleeding a lot or are passing blood clots.  · You develop a fever of 102° F (38.9° C) or higher.  · You have abnormal vaginal discharge.  · You have cramps that do not go away with medicine.  · You feel lightheaded, dizzy, or pass out (faint).  MAKE SURE YOU:   · Understand these instructions.  · Will watch your condition.  · Will get help right away if you are not doing well or get worse.  Document Released: 05/08/2008 Document Revised: 02/12/2012 Document Reviewed: 06/19/2013  ExitCare® Patient Information ©2015 [employer]. This information is not intended to replace advice given to you by your health care provider. Make sure you discuss any questions you have with your health care provider.  Colposcopy  Colposcopy is a procedure to examine your cervix and vagina, or the area around the outside of your vagina, for abnormalities or signs of disease. The procedure is done using a lighted microscope called a colposcope. Tissue samples may be collected during the colposcopy if your health care provider finds any unusual cells. A colposcopy may be done if a woman has:  · An abnormal Pap test. A Pap test is a medical test done to evaluate cells that are on the surface of the cervix.  · A Pap test result  that is suggestive of human papillomavirus (HPV). This virus can cause genital warts and is linked to the development of cervical cancer.  · A sore on her cervix and the results of a Pap test were normal.  · Genital warts on the cervix or in or around the outside of the vagina.  · A mother who took the drug diethylstilbestrol (DES) while pregnant.  · Painful intercourse.  · Vaginal bleeding, especially after sexual intercourse.  LET YOUR HEALTH CARE PROVIDER KNOW ABOUT:  · Any allergies you have.  · All medicines you are taking, including vitamins, herbs, eye drops, creams, and over-the-counter medicines.  · Previous problems you or members of your family have had with the use of anesthetics.  · Any blood disorders you have.  · Previous surgeries you have had.  · Medical conditions you have.  RISKS AND COMPLICATIONS  Generally, a colposcopy is a safe procedure. However, as with any procedure, complications can occur. Possible complications include:  · Bleeding.  · Infection.  · Missed lesions.  BEFORE THE PROCEDURE   · Tell your health care provider if you have your menstrual period. A colposcopy typically is not done during menstruation.  · For 24 hours before the colposcopy, do not:  ¨ Douche.  ¨ Use   tampons.  ¨ Use medicines, creams, or suppositories in the vagina.  ¨ Have sexual intercourse.  PROCEDURE   During the procedure, you will be lying on your back with your feet in foot rests (stirrups). A warm metal or plastic instrument (speculum) will be placed in your vagina to keep it open and to allow the health care provider to see the cervix. The colposcope will be placed outside the vagina. It will be used to magnify and examine the cervix, vagina, and the area around the outside of the vagina. A small amount of liquid solution will be placed on the area that is to be viewed. This solution will make it easier to see the abnormal cells. Your health care provider will use tools to suck out mucus and cells from  the canal of the cervix. Then he or she will record the location of the abnormal areas.  If a biopsy is done during the procedure, a medicine will usually be given to numb the area (local anesthetic). You may feel mild pain or cramping while the biopsy is done. After the procedure, tissue samples collected during the biopsy will be sent to a lab for analysis.  AFTER THE PROCEDURE   You will be given instructions on when to follow up with your health care provider for your test results. It is important to keep your appointment.  Document Released: 02/10/2003 Document Revised: 07/23/2013 Document Reviewed: 06/19/2013  ExitCare® Patient Information ©2015 [employer]. This information is not intended to replace advice given to you by your health care provider. Make sure you discuss any questions you have with your health care provider.

## 2015-04-12 ENCOUNTER — Ambulatory Visit (INDEPENDENT_AMBULATORY_CARE_PROVIDER_SITE_OTHER): Payer: 59 | Admitting: Physician Assistant

## 2015-04-12 VITALS — BP 116/66 | HR 111 | Temp 98.2°F | Resp 18 | Ht 63.5 in | Wt 171.0 lb

## 2015-04-12 DIAGNOSIS — Z23 Encounter for immunization: Secondary | ICD-10-CM

## 2015-04-12 DIAGNOSIS — Z111 Encounter for screening for respiratory tuberculosis: Secondary | ICD-10-CM | POA: Diagnosis not present

## 2015-04-12 DIAGNOSIS — Z0289 Encounter for other administrative examinations: Secondary | ICD-10-CM | POA: Diagnosis not present

## 2015-04-12 DIAGNOSIS — Z3009 Encounter for other general counseling and advice on contraception: Secondary | ICD-10-CM

## 2015-04-12 DIAGNOSIS — R55 Syncope and collapse: Secondary | ICD-10-CM | POA: Diagnosis not present

## 2015-04-12 DIAGNOSIS — Z309 Encounter for contraceptive management, unspecified: Secondary | ICD-10-CM | POA: Diagnosis not present

## 2015-04-12 LAB — POCT CBC
GRANULOCYTE PERCENT: 60.9 % (ref 37–80)
HCT, POC: 42.4 % (ref 37.7–47.9)
Hemoglobin: 14 g/dL (ref 12.2–16.2)
Lymph, poc: 2.6 (ref 0.6–3.4)
MCH, POC: 28.7 pg (ref 27–31.2)
MCHC: 32.9 g/dL (ref 31.8–35.4)
MCV: 87.2 fL (ref 80–97)
MID (CBC): 0.2 (ref 0–0.9)
MPV: 7.7 fL (ref 0–99.8)
POC GRANULOCYTE: 4.4 (ref 2–6.9)
POC LYMPH %: 36.4 % (ref 10–50)
POC MID %: 2.7 %M (ref 0–12)
Platelet Count, POC: 243 10*3/uL (ref 142–424)
RBC: 4.87 M/uL (ref 4.04–5.48)
RDW, POC: 12.9 %
WBC: 7.2 10*3/uL (ref 4.6–10.2)

## 2015-04-12 LAB — POCT URINALYSIS DIPSTICK
BILIRUBIN UA: NEGATIVE
Glucose, UA: NEGATIVE
Ketones, UA: NEGATIVE
NITRITE UA: NEGATIVE
PH UA: 5.5
Protein, UA: NEGATIVE
Spec Grav, UA: 1.025
UROBILINOGEN UA: 0.2

## 2015-04-12 LAB — COMPLETE METABOLIC PANEL WITH GFR
ALT: 15 U/L (ref 0–35)
AST: 18 U/L (ref 0–37)
Albumin: 4 g/dL (ref 3.5–5.2)
Alkaline Phosphatase: 55 U/L (ref 39–117)
BILIRUBIN TOTAL: 0.7 mg/dL (ref 0.2–1.2)
BUN: 12 mg/dL (ref 6–23)
CALCIUM: 9.1 mg/dL (ref 8.4–10.5)
CHLORIDE: 102 meq/L (ref 96–112)
CO2: 25 mEq/L (ref 19–32)
Creat: 0.69 mg/dL (ref 0.50–1.10)
GFR, Est African American: >89 mL/min
GFR, Est Non African American: >89 mL/min
GLUCOSE: 86 mg/dL (ref 70–99)
Potassium: 3.8 mEq/L (ref 3.5–5.3)
Sodium: 136 mEq/L (ref 135–145)
TOTAL PROTEIN: 6.9 g/dL (ref 6.0–8.3)

## 2015-04-12 LAB — POCT UA - MICROSCOPIC ONLY
CASTS, UR, LPF, POC: NEGATIVE
CRYSTALS, UR, HPF, POC: NEGATIVE
Mucus, UA: NEGATIVE
RBC, urine, microscopic: NEGATIVE
Yeast, UA: NEGATIVE

## 2015-04-12 LAB — POCT URINE PREGNANCY: Preg Test, Ur: NEGATIVE

## 2015-04-12 MED ORDER — NORGESTIM-ETH ESTRAD TRIPHASIC 0.18/0.215/0.25 MG-35 MCG PO TABS: 1.0000 | ORAL_TABLET | Freq: Every day | ORAL | Status: DC

## 2015-04-12 NOTE — Progress Notes (Signed)

## 2015-04-12 NOTE — Patient Instructions (Signed)
Please hydrate well.  Please drink at least 64 oz of water per day (~4 regular sized water bottles) Please await referral for cardiology, and unfortunately for now, I would like you to refrain from intense physical activity until cardiology can clear you for exercise.

## 2015-04-12 NOTE — Progress Notes (Signed)
[facility] [address], [place] [id number] [phone]  Date:  04/12/2015   Name:  Sherry Campbell   DOB:  Dec 30, 1985   MRN:  [id number]  PCP:  No primary care provider on file.    Chief Complaint: Annual Exam and Immunizations   History of Present Illness:  Sherry Campbell is a 29 y.o. very pleasant female patient who presents with the following:  Patient is here today to have a form completed.    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  She has gained weight in the last 6 months.  This week, joined the gym and doing a challenge.  3-4 days of week in process.  Water intake with a glass of water per day.  She drinks mostly sodas.    Urination: Normal urination.  More on the yellow side.  No hematuria, dribbling, or dysuria.  Bowels: BID, no blood in the stool, diarrhea, constipation, nausea or vomiting.  Passed out waiting in line for food, halloween.  No recent happenings.  She hit her head during her episode last summer.  She had not drink anything during that time.  She felt weak, hot, heart racing.  The 2nd time she had not eaten all day.  She has hx of diabetes in family.  No nausea, vomiting.  She has a lot of fatigue, and sleepiness.  She notes that she feels sob with very little exertion, such as just walking up a flight of stairs.  She has no chest pain, numbness, or tingling associated with symptoms.  She states that the only thing that helped was sitting down.  She has had a hx of heart palpitations.   There is no familial hx of sudden death or cardiac arrest. Mother is at ESRF.    Patient Active Problem List   Diagnosis Date Noted  . Dysplasia of cervix, low grade (CIN 1) 03/05/2015    Past Medical History  Diagnosis Date  . Anxiety   . Depression     History reviewed. No pertinent past surgical history.  History  Substance Use Topics  . Smoking  status: Never Smoker   . Smokeless tobacco: Never Used  . Alcohol Use: 0.0 oz/week    0 Standard drinks or equivalent per week     Comment: SOCIALLY    Family History  Problem Relation Age of Onset  . Diabetes Mother   . Hypertension Mother   . Diabetes Father   . Diabetes Maternal Grandmother   . Diabetes Maternal Grandfather   . Heart disease Maternal Grandfather   . Diabetes Paternal Grandmother   . Diabetes Paternal Grandfather     No Known Allergies  Medication list has been reviewed and updated.  No current outpatient prescriptions on file prior to visit.   No current facility-administered medications on file prior to visit.    Review of Systems: ROS otherwise unremarkable unless listed above.   Physical Examination: Filed Vitals:   04/12/15 1010  BP: 116/66  Pulse: 111  Temp: 98.2 F (36.8 C)  Resp: 18   Filed Vitals:   04/12/15 1010  Height: 5' 3.5" (1.613 m)  Weight: 171 lb (77.565 kg)   Body mass index is 29.81 kg/(m^2). Ideal Body Weight: Weight in (lb) to have BMI = 25: 143.1 Physical Exam  Constitutional: She is oriented to person, place, and time. She appears well-developed and well-nourished.  HENT:  Head: Normocephalic and atraumatic.  Eyes: Conjunctivae and EOM are normal. Pupils are equal, round, and reactive to light. Right eye exhibits no discharge. Left eye exhibits no discharge.  Neck: Normal range of motion. Neck supple. No thyromegaly present.  Cardiovascular: Normal rate, regular rhythm, normal heart sounds and intact distal pulses.  Exam reveals no gallop, no distant heart sounds and no friction rub.   No murmur heard. Pulses:      Carotid pulses are 2+ on the right side, and 2+ on the left side.      Radial pulses are 2+ on the right side, and 2+ on the left side.       Dorsalis pedis pulses are 2+ on the right side, and 2+ on the left side.       Posterior tibial pulses are 2+ on the right side, and 2+ on the left side.   Pulmonary/Chest: Effort normal and breath sounds normal. No respiratory distress. She has no wheezes.  Abdominal: Soft. Bowel sounds are normal. She exhibits no distension and no mass. There is no tenderness.  Lymphadenopathy:    She has no cervical adenopathy.  Neurological: She is alert and oriented to person, place, and time. She has normal reflexes. She displays a negative Romberg sign.  Normal F to N  Skin: Skin is warm and dry.  Psychiatric: She has a normal mood and affect. Her behavior is normal.    EKG reviewed by Dr. Neva SeatGreene: Sinus bradycardia without acute findings.  Assessment and Plan: [age] year old female is here today for chief complaint of syncope and to fill out a form.  I am referring her to cardiology for further work up.  She will refrain from exercise until cleared by doctor. -Updated TDAP,  -TB skin test placed to return in 48-72 hours. -Varicella titer  -If cardiology reaps no findings and symptoms continue, will refer to neuro -Advised to hydrate more (>64 oz water per day) -Started ortho tri-cyclen  Syncope, unspecified syncope type - Plan: POCT UA - Microscopic Only, POCT urinalysis dipstick, POCT urine pregnancy, TDAP vaccine greater than or equal to 27 IM, TSH, COMPLETE METABOLIC PANEL WITH GFR, EKG 12-Lead, POCT CBC, Ambulatory referral to Cardiology  Need for TDAP vaccination - Plan: TDAP vaccine greater than or equal to 27 IM  Encounter for completion of form with patient - Plan: Varicella zoster antibody, IgG, TDAP vaccine greater than or equal to 27 IM  Birth control counseling - Plan: Norgestimate-Ethinyl Estradiol Triphasic 0.18/0.215/0.25 MG-35 MCG tablet  Screening for tuberculosis - Plan: PPD  Trena PlattStephanie Nashiya Disbrow, PA-C [facility] 5/9/20168:32 PM

## 2015-04-13 LAB — VARICELLA ZOSTER ANTIBODY, IGG: VARICELLA IGG: 1716 {index} — AB (ref <135.00)

## 2015-04-13 LAB — TSH: TSH: 2.214 u[IU]/mL (ref 0.350–4.500)

## 2015-04-14 ENCOUNTER — Encounter: Payer: Self-pay | Admitting: Family Medicine

## 2015-04-14 ENCOUNTER — Ambulatory Visit (INDEPENDENT_AMBULATORY_CARE_PROVIDER_SITE_OTHER): Payer: 59

## 2015-04-14 DIAGNOSIS — Z111 Encounter for screening for respiratory tuberculosis: Secondary | ICD-10-CM

## 2015-04-14 DIAGNOSIS — Z7689 Persons encountering health services in other specified circumstances: Secondary | ICD-10-CM

## 2015-04-14 LAB — TB SKIN TEST
Induration: 0 mm
TB SKIN TEST: NEGATIVE

## 2015-04-29 ENCOUNTER — Ambulatory Visit (INDEPENDENT_AMBULATORY_CARE_PROVIDER_SITE_OTHER): Payer: 59 | Admitting: *Deleted

## 2015-04-29 DIAGNOSIS — Z111 Encounter for screening for respiratory tuberculosis: Secondary | ICD-10-CM | POA: Diagnosis not present

## 2015-04-29 NOTE — Progress Notes (Signed)
   Subjective:    Patient ID: Sherry Campbell, female    DOB: January 19, 1986, 29 y.o.   MRN: [id number]  HPI  Tuberculosis Risk Questionnaire  1. No Were you born outside the [place] in one of the following parts of the world: [place], [place], [place], [place] or [place]?    2. No Have you traveled outside the [place] and lived for more than one month in one of the following parts of the world: [place], [place], [place], [place] or [place]?    3. No Do you have a compromised immune system such as from any of the following conditions:HIV/AIDS, organ or bone marrow transplantation, diabetes, immunosuppressive medicines (e.g. Prednisone, Remicaide), leukemia, lymphoma, cancer of the head or neck, gastrectomy or jejunal bypass, end-stage renal disease (on dialysis), or silicosis?     4. Yes  Have you ever or do you plan on working in: a residential care center, a health care facility, a jail or prison or homeless shelter?    5. No Have you ever: injected illegal drugs, used crack cocaine, lived in a homeless shelter  or been in jail or prison?     6. No Have you ever been exposed to anyone with infectious tuberculosis?    Tuberculosis Symptom Questionnaire  Do you currently have any of the following symptoms?  1. No Unexplained cough lasting more than 3 weeks?   2. No Unexplained fever lasting more than 3 weeks.   3. No Night Sweats (sweating that leaves the bedclothes and sheets wet)     4. No Shortness of Breath   5. No Chest Pain   6. No Unintentional weight loss    7. No Unexplained fatigue (very tired for no reason)     Review of Systems     Objective:   Physical Exam        Assessment & Plan:

## 2015-05-02 ENCOUNTER — Ambulatory Visit (INDEPENDENT_AMBULATORY_CARE_PROVIDER_SITE_OTHER): Payer: 59 | Admitting: *Deleted

## 2015-05-02 DIAGNOSIS — Z7689 Persons encountering health services in other specified circumstances: Secondary | ICD-10-CM

## 2015-05-02 LAB — TB SKIN TEST
INDURATION: 0 mm
TB Skin Test: NEGATIVE

## 2015-05-02 NOTE — Progress Notes (Signed)
   Subjective:    Patient ID: Sherry AlfredBeatriz Campbell, female    DOB: Dec 19, 1985, 29 y.o.   MRN: [id number]  HPI Patient here for ppd reading. 0mm induration, negative reading.   Review of Systems     Objective:   Physical Exam        Assessment & Plan:

## 2015-05-07 ENCOUNTER — Ambulatory Visit: Payer: 59 | Admitting: Cardiology

## 2015-07-05 DIAGNOSIS — F419 Anxiety disorder, unspecified: Secondary | ICD-10-CM | POA: Insufficient documentation

## 2015-07-05 DIAGNOSIS — F32A Depression, unspecified: Secondary | ICD-10-CM | POA: Insufficient documentation

## 2015-07-05 DIAGNOSIS — R55 Syncope and collapse: Secondary | ICD-10-CM | POA: Insufficient documentation

## 2015-07-05 DIAGNOSIS — E119 Type 2 diabetes mellitus without complications: Secondary | ICD-10-CM | POA: Insufficient documentation

## 2015-07-05 DIAGNOSIS — R001 Bradycardia, unspecified: Secondary | ICD-10-CM | POA: Insufficient documentation

## 2015-07-05 DIAGNOSIS — F329 Major depressive disorder, single episode, unspecified: Secondary | ICD-10-CM | POA: Insufficient documentation

## 2015-07-07 NOTE — Progress Notes (Signed)
     HPI: 29 yo female for evaluation of syncope. Patient has had 3 syncopal episodes. The first occurred when she was in high school. She was outside working with [employer]. She had been standing in the sun. She developed sudden diaphoresis, warm feeling, dizziness and then syncope. No preceding chest pain, nausea, dyspnea or palpitations. Unconscious for several seconds. She had her last episode while standing in line at [employer]. Similar description as above. She has had no syncopal event in the past 1 year. There is no chest pain. She typically does not have significant palpitations. She has mild dyspnea on exertion but no orthopnea, PND or pedal edema.  Current Outpatient Prescriptions  Medication Sig Dispense Refill  . Norgestimate-Ethinyl Estradiol Triphasic 0.18/0.215/0.25 MG-35 MCG tablet Take 1 tablet by mouth daily. 1 Package 11   No current facility-administered medications for this visit.    No Known Allergies   Past Medical History  Diagnosis Date  . Anxiety   . Depression   . Syncope   . Anxiety   . Depression   . Dysplasia of cervix, low grade (CIN 1)     Past Surgical History  Procedure Laterality Date  . No past surgeries      History   Social History  . Marital Status: Single    Spouse Name: N/A  . Number of Children: N/A  . Years of Education: N/A   Occupational History  .      Bartender   Social History Main Topics  . Smoking status: Never Smoker   . Smokeless tobacco: Never Used  . Alcohol Use: 0.0 oz/week    0 Standard drinks or equivalent per week     Comment: SOCIALLY  . Drug Use: No  . Sexual Activity: Yes    Birth Control/ Protection:      Comment: INTERCOURSE AGE 87, SEXUAL PARTNERS MORE THAN 5   Other Topics Concern  . Not on file   Social History Narrative    Family History  Problem Relation Age of Onset  . Diabetes Mother   . Hypertension Mother   . Diabetes Father   . Diabetes Maternal Grandmother   . Diabetes Maternal  Grandfather   . Heart disease Maternal Grandfather   . Diabetes Paternal Grandmother   . Diabetes Paternal Grandfather     ROS: no fevers or chills, productive cough, hemoptysis, dysphasia, odynophagia, melena, hematochezia, dysuria, hematuria, rash, seizure activity, orthopnea, PND, pedal edema, claudication. Remaining systems are negative.  Physical Exam:   Blood pressure 120/72, pulse 84, height  (1.575 m), weight 174 lb 3.2 oz (79.017 kg).  General:  Well developed/well nourished in NAD Skin warm/dry Patient not depressed No peripheral clubbing Back-normal HEENT-normal/normal eyelids Neck supple/normal carotid upstroke bilaterally; no bruits; no JVD; no thyromegaly chest - CTA/ normal expansion CV - RRR/normal S1 and S2; no murmurs, rubs or gallops;  PMI nondisplaced Abdomen -NT/ND, no HSM, no mass, + bowel sounds, no bruit 2+ femoral pulses, no bruits Ext-no edema, chords, 2+ DP Neuro-grossly nonfocal  ECG sinus rhythm at a rate of 84. Left axis deviation. No ST changes.

## 2015-07-09 ENCOUNTER — Encounter: Payer: Self-pay | Admitting: *Deleted

## 2015-07-09 ENCOUNTER — Encounter: Payer: Self-pay | Admitting: Cardiology

## 2015-07-09 ENCOUNTER — Ambulatory Visit (INDEPENDENT_AMBULATORY_CARE_PROVIDER_SITE_OTHER): Payer: 59 | Admitting: Cardiology

## 2015-07-09 VITALS — BP 120/72 | HR 84 | Ht 62.0 in | Wt 174.2 lb

## 2015-07-09 DIAGNOSIS — R55 Syncope and collapse: Secondary | ICD-10-CM | POA: Diagnosis not present

## 2015-07-09 DIAGNOSIS — F419 Anxiety disorder, unspecified: Secondary | ICD-10-CM | POA: Diagnosis not present

## 2015-07-09 NOTE — Assessment & Plan Note (Signed)
Event sounds vagally mediated. Each occurred while standing for significant amounts of time when preceded by diaphoresis, hot feeling followed by syncope. We will check an echocardiogram for LV function. I've asked her to increase hydration and sodium intake. We will see her back in one year. Note her last event was approximately one year ago.

## 2015-07-09 NOTE — Patient Instructions (Signed)

## 2015-07-09 NOTE — Assessment & Plan Note (Signed)
Management per primary care. 

## 2015-07-15 ENCOUNTER — Ambulatory Visit (HOSPITAL_COMMUNITY): Payer: 59

## 2015-08-04 ENCOUNTER — Other Ambulatory Visit (HOSPITAL_COMMUNITY): Payer: 59

## 2015-08-19 ENCOUNTER — Telehealth (HOSPITAL_COMMUNITY): Payer: Self-pay | Admitting: *Deleted

## 2015-10-25 ENCOUNTER — Ambulatory Visit (INDEPENDENT_AMBULATORY_CARE_PROVIDER_SITE_OTHER): Payer: 59 | Admitting: Family Medicine

## 2015-10-25 VITALS — BP 122/68 | HR 76 | Temp 98.7°F | Resp 16 | Ht 63.0 in | Wt 176.8 lb

## 2015-10-25 DIAGNOSIS — Z113 Encounter for screening for infections with a predominantly sexual mode of transmission: Secondary | ICD-10-CM

## 2015-10-25 DIAGNOSIS — I951 Orthostatic hypotension: Secondary | ICD-10-CM | POA: Diagnosis not present

## 2015-10-25 DIAGNOSIS — Z8742 Personal history of other diseases of the female genital tract: Secondary | ICD-10-CM

## 2015-10-25 DIAGNOSIS — R55 Syncope and collapse: Secondary | ICD-10-CM

## 2015-10-25 DIAGNOSIS — R102 Pelvic and perineal pain: Secondary | ICD-10-CM

## 2015-10-25 DIAGNOSIS — B373 Candidiasis of vulva and vagina: Secondary | ICD-10-CM

## 2015-10-25 DIAGNOSIS — B3731 Acute candidiasis of vulva and vagina: Secondary | ICD-10-CM

## 2015-10-25 DIAGNOSIS — N898 Other specified noninflammatory disorders of vagina: Secondary | ICD-10-CM

## 2015-10-25 DIAGNOSIS — Z8619 Personal history of other infectious and parasitic diseases: Secondary | ICD-10-CM

## 2015-10-25 LAB — POCT CBC
GRANULOCYTE PERCENT: 59 % (ref 37–80)
HEMATOCRIT: 39.1 % (ref 37.7–47.9)
HEMOGLOBIN: 13.2 g/dL (ref 12.2–16.2)
Lymph, poc: 2.6 (ref 0.6–3.4)
MCH, POC: 29.2 pg (ref 27–31.2)
MCHC: 33.8 g/dL (ref 31.8–35.4)
MCV: 86.4 fL (ref 80–97)
MID (cbc): 0.6 (ref 0–0.9)
MPV: 7.3 fL (ref 0–99.8)
POC GRANULOCYTE: 4.6 (ref 2–6.9)
POC LYMPH PERCENT: 32.7 %L (ref 10–50)
POC MID %: 8.3 %M (ref 0–12)
Platelet Count, POC: 258 10*3/uL (ref 142–424)
RBC: 4.53 M/uL (ref 4.04–5.48)
RDW, POC: 11.8 %
WBC: 7.8 10*3/uL (ref 4.6–10.2)

## 2015-10-25 LAB — POC MICROSCOPIC URINALYSIS (UMFC): Mucus: ABSENT

## 2015-10-25 LAB — POCT WET + KOH PREP: Trich by wet prep: ABSENT

## 2015-10-25 LAB — POCT URINALYSIS DIP (MANUAL ENTRY)
BILIRUBIN UA: NEGATIVE
GLUCOSE UA: NEGATIVE
Nitrite, UA: NEGATIVE
Protein Ur, POC: NEGATIVE
RBC UA: NEGATIVE
SPEC GRAV UA: 1.025
Urobilinogen, UA: 0.2
pH, UA: 6.5

## 2015-10-25 LAB — POCT URINE PREGNANCY: PREG TEST UR: NEGATIVE

## 2015-10-25 LAB — POCT SEDIMENTATION RATE: POCT SED RATE: 26 mm/h — AB (ref 0–22)

## 2015-10-25 MED ORDER — FLUCONAZOLE 150 MG PO TABS: 150.0000 mg | ORAL_TABLET | Freq: Once | ORAL | Status: DC

## 2015-10-25 MED ORDER — LEVONORGESTREL-ETHINYL ESTRAD 0.1-20 MG-MCG PO TABS: 1.0000 | ORAL_TABLET | Freq: Every day | ORAL | Status: DC

## 2015-10-25 MED ORDER — METRONIDAZOLE 500 MG PO TABS: 500.0000 mg | ORAL_TABLET | Freq: Two times a day (BID) | ORAL | Status: DC

## 2015-10-25 NOTE — Progress Notes (Signed)
Subjective:    Patient ID: Sherry Campbell, female    DOB: 09/14/1986, 29 y.o.   MRN: [id number] This chart was scribed for Sherry SorensonEva Ronav Furney, MD by Sherry Campbell, Medical Scribe. This patient was seen in Room 12 and the patient's care was started a 4:39 PM.  Chief Complaint  Patient presents with  . SEXUALLY TRANSMITTED DISEASE  . Vaginal Discharge  . Back Pain    HPI HPI Comments: Sherry AlfredBeatriz Dorey is a 29 y.o. female who [facility] complaining of vaginal discharge and possible STI. She has been in a long distance relationship, and her boyfriend recently cheated on her and did not use protection. She has requested being screened for all STIs. She had trichomonas last year, and her current sx a reminiscent of that. She has had lower back pain and pelvic pain that resolved after drinking a lot of water. She has current yellow to green vaginal discharge. Pt has stopped taking her birth control pills, because she is about to go on a trip and did not want to be on her menses during her trip, so she stopped her pills to have her menses early and reset her cycle.   Pt was seen in the office six months ago after her second episode of syncope. She was referred to cardio, who recommend echocardiogram, but she could not afford to get the procedure. She was told to increase salt and food intake and avoid prolonged standing. She reports her sx have decreased in severity but increased in frequency. They always occur when she is standing for a long time often when she is outside, and often when she has not eaten or drank in a while. Her syncopal sx always begin with the feeling of tachycardia and presyncope, and last for 15 minutes. They resolve more quickly if she eats or sits down. She has a family hx of DM, and a PMHx of panic attacks, but her syncopal sx feel very different. Her syncopal sx have been ongoing for the last year, and she notes that she has been under increased stress for the last year. She does not  like salt, and has not increased her salt intake as recommended.  She also had an abnormal pap with ascus and was referred to gynocology who did colposcopy and biopsy and recommended her for recheck in six months. It has not yet been six months, and she is very worried what this may mean for her future fertility.  Past Medical History  Diagnosis Date  . Anxiety   . Depression   . Syncope   . Anxiety   . Depression   . Dysplasia of cervix, low grade (CIN 1)    No Known Allergies  No current outpatient prescriptions on file prior to visit.   No current facility-administered medications on file prior to visit.    Review of Systems  Constitutional: Negative for fever and chills.  Cardiovascular: Positive for palpitations. Negative for chest pain.  Genitourinary: Positive for vaginal discharge, vaginal pain and pelvic pain.  Musculoskeletal: Positive for back pain.  Neurological: Positive for syncope.      Objective:  BP 122/68 mmHg  Pulse 76  Temp(Src) 98.7 F (37.1 C) (Oral)  Resp 16  Ht 5\' 3"  (1.6 m)  Wt 176 lb 12.8 oz (80.196 kg)  BMI 31.33 kg/m2  SpO2 98%  LMP 10/09/2015  Physical Exam  Constitutional: She is oriented to person, place, and time. She appears well-developed and well-nourished. No distress.  HENT:  Head: Normocephalic and atraumatic.  Eyes: Pupils are equal, round, and reactive to light.  Neck: Neck supple.  Cardiovascular: Normal rate, regular rhythm and normal heart sounds.   No murmur heard. Pulmonary/Chest: Effort normal. No respiratory distress.  Genitourinary:  External labia majora minora normal. Vaginal vault with moderate amount of thick which chunky discharge and small green froth. Normal bimanual exam.  Musculoskeletal: Normal range of motion.  Neurological: She is alert and oriented to person, place, and time. Coordination normal.  Skin: Skin is warm and dry. She is not diaphoretic.  Psychiatric: She has a normal mood and affect. Her  behavior is normal.  Nursing note and vitals reviewed.  Orthostatics: Laying: BP 137/83, Pulse 80 - Sitting BP 122/76, pulse 75 - Standing: 119/79, Pulse 99    Assessment & Plan:   1. Vaginal discharge   2. Pelvic pain in female   3. Pre-syncope   4. Screening for STD (sexually transmitted disease)   5. Orthostatic hypotension   6. Vaginal moniliasis   7. History of trichomonal vaginitis     Orders Placed This Encounter  Procedures  . GC/Chlamydia Probe Amp  . Trichomonas vaginalis, RNA  . RPR  . HIV antibody  . Thyroid Panel With TSH  . Hepatitis C antibody  . POCT urinalysis dipstick  . POCT Microscopic Urinalysis ([facility])  . POCT SEDIMENTATION RATE  . POCT urine pregnancy  . POCT Wet + KOH Prep  . POCT CBC    Meds ordered this encounter  Medications  . levonorgestrel-ethinyl estradiol (AVIANE,ALESSE,LESSINA) 0.1-20 MG-MCG tablet    Sig: Take 1 tablet by mouth daily.    Dispense:  3 Package    Refill:  4  . metroNIDAZOLE (FLAGYL) 500 MG tablet    Sig: Take 1 tablet (500 mg total) by mouth 2 (two) times daily.    Dispense:  14 tablet    Refill:  0  . DISCONTD: fluconazole (DIFLUCAN) 150 MG tablet    Sig: Take 1 tablet (150 mg total) by mouth once. Repeat if needed after antibiotic course is complete    Dispense:  2 tablet    Refill:  0  . fluconazole (DIFLUCAN) 150 MG tablet    Sig: Take 1 tablet (150 mg total) by mouth once. Repeat if needed after antibiotic course is complete    Dispense:  2 tablet    Refill:  1    I personally performed the services described in this documentation, which was scribed in my presence. The recorded information has been reviewed and considered, and addended by me as needed.  Sherry Sorenson, MD MPH    By signing my name below, I, Javier Docker, attest that this documentation has been prepared under the direction and in the presence of Sherry Sorenson, MD. Electronically Signed: Javier Docker, ER Scribe. 10/25/2015. 4:39 PM.   Results  for orders placed or performed in visit on 10/25/15  GC/Chlamydia Probe Amp  Result Value Ref Range   CT Probe RNA NEGATIVE    GC Probe RNA NEGATIVE   Trichomonas vaginalis, RNA  Result Value Ref Range   T vaginalis RNA Negative   RPR  Result Value Ref Range   RPR Ser Ql NON REAC NON REAC  HIV antibody  Result Value Ref Range   HIV 1&2 Ab, 4th Generation NONREACTIVE NONREACTIVE  Thyroid Panel With TSH  Result Value Ref Range   T4, Total 7.1 4.5 - 12.0 ug/dL   T3 Uptake 30 22 - 35 %  Free Thyroxine Index 2.1 1.4 - 3.8   TSH 0.923 0.350 - 4.500 uIU/mL  Hepatitis C antibody  Result Value Ref Range   HCV Ab NEGATIVE NEGATIVE  POCT urinalysis dipstick  Result Value Ref Range   Color, UA yellow yellow   Clarity, UA clear clear   Glucose, UA negative negative   Bilirubin, UA negative negative   Ketones, POC UA trace (5) (A) negative   Spec Grav, UA 1.025    Blood, UA negative negative   pH, UA 6.5    Protein Ur, POC negative negative   Urobilinogen, UA 0.2    Nitrite, UA Negative Negative   Leukocytes, UA Trace (A) Negative  POCT Microscopic Urinalysis ([facility])  Result Value Ref Range   WBC,UR,HPF,POC Few (A) None WBC/hpf   RBC,UR,HPF,POC None None RBC/hpf   Bacteria Few (A) None, Too numerous to count   Mucus Absent Absent   Epithelial Cells, UR Per Microscopy Few (A) None, Too numerous to count cells/hpf  POCT SEDIMENTATION RATE  Result Value Ref Range   POCT SED RATE 26 (A) 0 - 22 mm/hr  POCT urine pregnancy  Result Value Ref Range   Preg Test, Ur Negative Negative  POCT Wet + KOH Prep  Result Value Ref Range   Yeast by KOH Present Present, Absent   Yeast by wet prep Present Present, Absent   WBC by wet prep Too numerous to count  None, Few, Too numerous to count   Clue Cells Wet Prep HPF POC Few (A) None, Too numerous to count   Trich by wet prep Absent Present, Absent   Bacteria Wet Prep HPF POC Moderate (A) None, Few, Too numerous to count   Epithelial Cells By  [employer] Pref ([facility]) Moderate (A) None, Few, Too numerous to count   RBC,UR,HPF,POC None None RBC/hpf  POCT CBC  Result Value Ref Range   WBC 7.8 4.6 - 10.2 K/uL   Lymph, poc 2.6 0.6 - 3.4   POC LYMPH PERCENT 32.7 10 - 50 %L   MID (cbc) 0.6 0 - 0.9   POC MID % 8.3 0 - 12 %M   POC Granulocyte 4.6 2 - 6.9   Granulocyte percent 59.0 37 - 80 %G   RBC 4.53 4.04 - 5.48 M/uL   Hemoglobin 13.2 12.2 - 16.2 g/dL   HCT, POC 27.2 53.6 - 47.9 %   MCV 86.4 80 - 97 fL   MCH, POC 29.2 27 - 31.2 pg   MCHC 33.8 31.8 - 35.4 g/dL   RDW, POC 64.4 %   Platelet Count, POC 258 142 - 424 K/uL   MPV 7.3 0 - 99.8 fL

## 2015-10-25 NOTE — Patient Instructions (Signed)

## 2015-10-26 LAB — GC/CHLAMYDIA PROBE AMP
CT PROBE, AMP APTIMA: NEGATIVE
GC PROBE AMP APTIMA: NEGATIVE

## 2015-10-26 LAB — THYROID PANEL WITH TSH
FREE THYROXINE INDEX: 2.1 (ref 1.4–3.8)
T3 UPTAKE: 30 % (ref 22–35)
T4, Total: 7.1 ug/dL (ref 4.5–12.0)
TSH: 0.923 u[IU]/mL (ref 0.350–4.500)

## 2015-10-26 LAB — HIV ANTIBODY (ROUTINE TESTING W REFLEX): HIV 1&2 Ab, 4th Generation: NONREACTIVE

## 2015-10-26 LAB — RPR

## 2015-10-26 LAB — HEPATITIS C ANTIBODY: HCV AB: NEGATIVE

## 2015-10-27 LAB — TRICHOMONAS VAGINALIS, PROBE AMP: TRICHOMONAS VAGINALIS PROBE APTIMA: NEGATIVE

## 2016-01-26 ENCOUNTER — Encounter: Payer: 59 | Admitting: Women's Health

## 2016-02-02 ENCOUNTER — Encounter: Payer: 59 | Admitting: Women's Health

## 2017-04-18 ENCOUNTER — Encounter: Payer: Self-pay | Admitting: Gynecology

## 2021-06-26 DIAGNOSIS — E119 Type 2 diabetes mellitus without complications: Secondary | ICD-10-CM | POA: Insufficient documentation

## 2021-07-13 LAB — HEPATITIS C ANTIBODY: HCV Ab: NEGATIVE

## 2021-07-13 LAB — OB RESULTS CONSOLE RUBELLA ANTIBODY, IGM: Rubella: IMMUNE

## 2021-07-13 LAB — OB RESULTS CONSOLE HEPATITIS B SURFACE ANTIGEN: Hepatitis B Surface Ag: NEGATIVE

## 2021-08-24 ENCOUNTER — Other Ambulatory Visit: Payer: Self-pay

## 2021-08-24 ENCOUNTER — Inpatient Hospital Stay: Payer: Self-pay

## 2021-08-24 ENCOUNTER — Inpatient Hospital Stay: Payer: Self-pay | Attending: Genetic Counselor | Admitting: Genetic Counselor

## 2021-08-24 DIAGNOSIS — Z803 Family history of malignant neoplasm of breast: Secondary | ICD-10-CM

## 2021-08-24 DIAGNOSIS — Z8481 Family history of carrier of genetic disease: Secondary | ICD-10-CM

## 2021-08-24 LAB — GENETIC SCREENING ORDER

## 2021-08-24 NOTE — Progress Notes (Addendum)
REFERRING PROVIDER: Truitt Merle, MD [address] [place],  [place] [address]  PRIMARY PROVIDER:  Eldridge Abrahams, FNP-C [place]. [place], [place] [address]  PRIMARY REASON FOR VISIT:  Encounter Diagnoses  Name Primary?   Family history of BRCA2 gene positive Yes   Family history of breast cancer     HISTORY OF PRESENT ILLNESS:   Sherry Campbell, a 35 y.o. female, was seen for a [place] cancer genetics consultation at the request of Dr. Burr Medico due to a family history of BRCA2 mutation in her mother.  Ms. Demarinis presents to clinic today to discuss the possibility of a hereditary predisposition to cancer, to discuss genetic testing, and to further clarify her future cancer risks, as well as potential cancer risks for family members.   Ms. Baiza is a 35 y.o. female with no personal history of cancer.    RISK FACTORS:  Menarche was at age 65.  First live birth at age: she is currently pregnant  OCP use for approximately 5 years.  Ovaries intact: yes.  Hysterectomy: no.  Menopausal status: premenopausal.  HRT use: 0 years. Colonoscopy: no Mammogram within the last year: no. Number of breast biopsies: 0. Any excessive radiation exposure in the past: no  Past Medical History:  Diagnosis Date   Anxiety    Anxiety    Depression    Depression    Dysplasia of cervix, low grade (CIN 1)    Syncope     Past Surgical History:  Procedure Laterality Date   NO PAST SURGERIES      Social History   Socioeconomic History   Marital status: Single    Spouse name: Not on file   Number of children: Not on file   Years of education: Not on file   Highest education level: Not on file  Occupational History    Comment: Bartender  Tobacco Use   Smoking status: Never   Smokeless tobacco: Never  Substance and Sexual Activity   Alcohol use: Yes    Alcohol/week: 0.0 standard drinks    Comment: SOCIALLY   Drug use: No   Sexual activity: Yes    Comment: INTERCOURSE AGE 63,  SEXUAL PARTNERS MORE THAN 5  Other Topics Concern   Not on file  Social History Narrative   Not on file   Social Determinants of Health   Financial Resource Strain: Not on file  Food Insecurity: Not on file  Transportation Needs: Not on file  Physical Activity: Not on file  Stress: Not on file  Social Connections: Not on file     FAMILY HISTORY:  We obtained a detailed, 4-generation family history.  Significant diagnoses are listed below:  Family History  Problem Relation Age of Onset   Breast cancer Mother 18   Other Mother        BRCA2 mutation   Breast cancer Paternal Aunt        dx. 31s   Bone cancer Paternal Grandfather        dx. 71s   Ovarian cancer Maternal Great-grandmother 64       Ms. Schoene's mother was diagnosed with breast cancer at 31 and her genetic testing found a BRCA2 mutation. Her maternal great grandmother was diagnosed with ovarian cancer at 50, she is deceased.    Ms. Inabinet paternal aunt was diagnosed with breast cancer in her 17s. Her paternal grandfather was diagnosed with brain cancer in his 75s and her paternal grandfather's sister was diagnosed with ovarian  cancer at an unknown age. There no reported Ashkenazi Jewish ancestry.   GENETIC COUNSELING ASSESSMENT: Ms. Caplin is a 35 y.o. female with a family history of a BRCA2 gene mutation. Specifically, her mother was found to have a BRCA2 gene mutation shortly after being diagnosed with breast cancer. We, therefore, discussed and recommended the following at today's visit.   DISCUSSION:  We discussed the cancer risks and management recommendations for individuals with a BRCA2 mutation as noted below with a focus on management in females.   The cancers associated with BRCA2 are: Female breast cancer, up to an 84% risk In women with a history of breast cancer, the cumulative risk for contralateral breast cancer 5 years after breast cancer diagnosis is 9%. The cumulative 20 year risk is  approximately 26%. Female breast cancer, up to an 8% risk Ovarian cancer, 17-27% risk Pancreatic cancer, up to a 7% risk Prostate cancer, up to a 34% risk Melanoma, elevated risk   Management Recommendations:  Breast Screening/Risk Reduction: Breast cancer screening includes: Breast awareness beginning at age 55 Monthly self-breast examination beginning at age 36 Clinical breast examination every 6-12 months beginning at age 12 or at the age of the earliest diagnosed breast cancer in the family, if onset was before age 41 Annual breast MRI with contrast beginning at age 59-29 (or annual mammograms with consideration of tomosynthesis if MRI is unavailable), although the age to initiate screening may be individualized based on family history Annual mammogram beginning at age 62 until age 40 with consideration of tomosynthesis with continuation of annual breast MRI with contrast The option of prophylactic bilateral risk-reducing mastectomy (RRM), removal of the breast tissue before cancer develops, is the best option for significantly decreasing the risk of developing breast cancer. Studies have shown mastectomies reduce the risk of breast cancer by 90-95% in women with a BRCA2 mutation. Breast reconstruction can also be performed immediately following the mastectomy, depending on the type of reconstruction chosen.   Gynecological Cancer Screening/Risk Reduction: It is recommended that women with a BRCA2 mutation consider having a risk-reducing salpingo oophorectomy (RRSO), removal of the ovaries and fallopian tubes, between the ages of 8-45 or once childbearing is completed. Having a RRSO is estimated to reduce the risk of ovarian cancer by up to 96%. There is still a small risk of developing an "ovarian-like" cancer in the lining of the abdomen, called the peritoneum. Another benefit to having the ovaries removed is the risk reduction for breast cancer. If the ovaries are removed before menopause,  the risk of developing breast cancer is reduced. Women undergoing a RRSO should be aware of the potential risks and benefits of concurrent hysterectomy. Hormone replacement therapy could be considered based on the physician's discretion. Ovarian cancer screening is an option for women who chose not to have a RRSO or who, as of yet, have not completed their family. Current screening methods for ovarian cancer are neither sensitive nor specific, meaning that often early stage ovarian cancer cannot be diagnosed through this screening.  Screening can also be falsely positive with no cancer present. For this reason, ovarian cancer screening is not recommended for women beyond their child-bearing years. If ovarian cancer screening is recommended by your physician, it could include: CA-125 blood tests Transvaginal ultrasounds Clinical pelvic exams   Skin Cancer Screening and Risk Reduction: Regular skin self-examinations Individuals should notify their physicians of any changes to moles such as increasing in size, darkening in color, or other change in appearance. Annual skin  examinations by a dermatologist   Pancreatic Cancer Screening/Risk Reduction: Avoid smoking, heavy alcohol use, and obesity. It has been suggested that pancreatic cancer screening be limited to those with a family history of pancreatic cancer (first- or second-degree relative). Ideally, screening should be performed in experienced centers utilizing a multidisciplinary approach under research conditions. Recommended screening could include annual endoscopic ultrasound (preferred) and/or MRI of the pancreas starting at age 40 or 78 years younger than the earliest age diagnosis in the family.  We discussed that some people do not want to undergo genetic testing due to fear of genetic discrimination.  A federal law called the Genetic Information Non-Discrimination Act (GINA) of 2008 helps protect individuals against genetic discrimination  based on their genetic test results.  It impacts both health insurance and employment.  With health insurance, it protects against increased premiums, being kicked off insurance or being forced to take a test in order to be insured.  For employment it protects against hiring, firing and promoting decisions based on genetic test results.  GINA does not apply to those in the [employer], those who work for companies with less than 15 employees, and new life insurance or long-term disability insurance policies.  Health status due to a cancer diagnosis is not protected under GINA.  PLAN:  After considering the risks, benefits, and limitations, Ms. Landsberg provided informed consent to pursue genetic testing and the blood sample was sent to [facility] for analysis of the BRCA2 gene mutation found in her mother. Results should be available within approximately 2-3 weeks' time, at which point they will be disclosed by telephone to Ms. Stcharles, as will any additional recommendations warranted by these results. Ms. Bergren will receive a summary of her genetic counseling visit and a copy of her results once available. This information will also be available in Epic.   Ms. Simerly questions were answered to her satisfaction today. Our contact information was provided should additional questions or concerns arise. Thank you for the referral and allowing [place] to share in the care of your patient.   Lucille Passy, MS, [facility] Genetic Counselor [email or web address] (P) [phone]  The patient was seen for a total of 40 minutes in face-to-face genetic counseling. The patient brought her two sisters. Drs. Magrinat, Lindi Adie and/or Burr Medico were available to discuss this case as needed.  _______________________________________________________________________ For Office Staff:  Number of people involved in session: 3 Was an Intern/ student involved with case: no

## 2021-08-26 ENCOUNTER — Encounter: Payer: Self-pay | Admitting: Genetic Counselor

## 2021-08-26 DIAGNOSIS — Z803 Family history of malignant neoplasm of breast: Secondary | ICD-10-CM | POA: Insufficient documentation

## 2021-08-26 DIAGNOSIS — Z8481 Family history of carrier of genetic disease: Secondary | ICD-10-CM | POA: Insufficient documentation

## 2021-09-05 ENCOUNTER — Telehealth: Payer: Self-pay | Admitting: Genetic Counselor

## 2021-09-05 ENCOUNTER — Encounter: Payer: Self-pay | Admitting: Genetic Counselor

## 2021-09-05 DIAGNOSIS — Z1379 Encounter for other screening for genetic and chromosomal anomalies: Secondary | ICD-10-CM | POA: Insufficient documentation

## 2021-09-05 NOTE — Telephone Encounter (Signed)
I contacted [name] to discuss her genetic testing results. The results were negative. She did not inherit the familial BRCA2 mutation.    The test report has been scanned into EPIC and is located under the Molecular Pathology section of the Results Review tab.  A portion of the result report is included below for reference. Detailed clinic note to follow.   Sherry Passy, MS, [facility] Genetic Counselor [email or web address] (P) [phone]

## 2021-09-06 ENCOUNTER — Telehealth (INDEPENDENT_AMBULATORY_CARE_PROVIDER_SITE_OTHER): Payer: Self-pay

## 2021-09-06 DIAGNOSIS — F32A Depression, unspecified: Secondary | ICD-10-CM

## 2021-09-06 DIAGNOSIS — O24119 Pre-existing diabetes mellitus, type 2, in pregnancy, unspecified trimester: Secondary | ICD-10-CM

## 2021-09-06 DIAGNOSIS — E119 Type 2 diabetes mellitus without complications: Secondary | ICD-10-CM

## 2021-09-06 DIAGNOSIS — O0990 Supervision of high risk pregnancy, unspecified, unspecified trimester: Secondary | ICD-10-CM

## 2021-09-06 DIAGNOSIS — Z3A00 Weeks of gestation of pregnancy not specified: Secondary | ICD-10-CM

## 2021-09-06 DIAGNOSIS — O99340 Other mental disorders complicating pregnancy, unspecified trimester: Secondary | ICD-10-CM

## 2021-09-06 NOTE — Progress Notes (Signed)
New OB Intake  I connected with  Sherry Campbell on 09/07/21 at  8:15 AM EDT by telephone and verified that I am speaking with the correct person using two identifiers. Nurse is located at [facility] and pt is located at home. Telephone visit approx 25 minutes.  I discussed the limitations, risks, security and privacy concerns of performing an evaluation and management service by telephone and the availability of in person appointments. I also discussed with the patient that there may be a patient responsible charge related to this service. The patient expressed understanding and agreed to proceed.  I explained I am completing New OB Intake today. Patient is transferring care from [place]. We discussed her EDD of 02/09/22 that is based on LMP of 05/05/21. Pt is G1/P0. I reviewed her allergies, medications, Medical/Surgical/OB history, and appropriate screenings. I informed her of [facility] services. Based on history, this is a high risk pregnancy due to newly diagnosed type 2 diabetes (diagnosed April 2022 per patient).   Patient Active Problem List   Diagnosis Date Noted   Supervision of high risk pregnancy, antepartum 09/07/2021   Genetic testing 09/05/2021   Family history of BRCA2 gene positive 08/26/2021   Family history of breast cancer 08/26/2021   Type 2 diabetes mellitus without complication, without long-term current use of insulin ([place]) 06/26/2021   Syncope 07/05/2015   Anxiety 07/05/2015   Depression 07/05/2015   Bradycardia 07/05/2015   Dysplasia of cervix, low grade (CIN 1) 03/05/2015   Concerns addressed today Depression: Patient states she is currently taking fluoxetine 40 mg daily. Patient would like to be scheduled for follow up of depression with [facility].   Referred to Diabetes Education for follow up of type 2 diabetes. Scheduled for 09/13/21. Requested that patient check fasting blood glucose and 2 hour post prandial after each meal.   Delivery Plans:  Plans to deliver at  [facility].   MyChart/Babyscripts MyChart access verified. I explained pt will have some visits in office and some virtually. Babyscripts instructions given and order placed.  Anatomy US Explained first scheduled [place] will be around 19 weeks. Anatomy US scheduled for 09/23/21 at 1400.  Labs Discussed Johnsie Cancel genetic screening with patient. Would like both Panorama and Horizon drawn at new OB visit. Routine prenatal labs needed.  COVID Vaccine Patient has had COVID vaccine x 3.   Mother/ Baby Dyad Candidate?    Not offered due to gestational age and limited availability of Mom/Baby Dyad new OB appts.  Social Determinants of Health Food Insecurity: Patient denies food insecurity. WIC Referral: Patient is interested in referral to [facility].  Transportation: Patient denies transportation needs. Childcare: Discussed no children allowed at ultrasound appointments. Offered childcare services; patient declines childcare services at this time.  First visit review I reviewed new OB appt with pt. I explained she will have a provider visit that includes any neccessary additional blood work.Explained pt will be seen by Gaylan Gerold, CNM at first visit; encounter routed to appropriate provider. Explained that patient will be seen by pregnancy navigator following visit with provider.  Annabell Howells, RN 09/07/2021  4:21 PM

## 2021-09-07 DIAGNOSIS — O0990 Supervision of high risk pregnancy, unspecified, unspecified trimester: Secondary | ICD-10-CM | POA: Insufficient documentation

## 2021-09-08 ENCOUNTER — Telehealth: Payer: Self-pay | Admitting: Clinical

## 2021-09-08 NOTE — Telephone Encounter (Signed)
Attempt to schedule pt, per referral; Left HIPPA-compliant message to call back Linzie Boursiquot from [facility] at  [phone] (Orean Giarratano's office); left MyChart message for pt.  °

## 2021-09-09 NOTE — Progress Notes (Signed)
Patient was assessed and managed by nursing staff during this encounter. I have reviewed the chart and agree with the documentation and plan.   Kandee Escalante, MSN, CNM, IBCLC 09/09/21 11:44 AM  

## 2021-09-13 ENCOUNTER — Encounter: Payer: Self-pay | Attending: Certified Nurse Midwife | Admitting: Registered"

## 2021-09-13 ENCOUNTER — Ambulatory Visit: Payer: Self-pay | Admitting: Registered"

## 2021-09-13 ENCOUNTER — Other Ambulatory Visit: Payer: Self-pay

## 2021-09-13 DIAGNOSIS — O24119 Pre-existing diabetes mellitus, type 2, in pregnancy, unspecified trimester: Secondary | ICD-10-CM | POA: Insufficient documentation

## 2021-09-13 DIAGNOSIS — O0990 Supervision of high risk pregnancy, unspecified, unspecified trimester: Secondary | ICD-10-CM | POA: Insufficient documentation

## 2021-09-15 ENCOUNTER — Ambulatory Visit: Payer: Self-pay | Admitting: Clinical

## 2021-09-15 ENCOUNTER — Other Ambulatory Visit: Payer: Self-pay

## 2021-09-15 ENCOUNTER — Ambulatory Visit (INDEPENDENT_AMBULATORY_CARE_PROVIDER_SITE_OTHER): Payer: Self-pay | Admitting: Certified Nurse Midwife

## 2021-09-15 VITALS — BP 107/65 | HR 88 | Wt 178.3 lb

## 2021-09-15 DIAGNOSIS — O0992 Supervision of high risk pregnancy, unspecified, second trimester: Secondary | ICD-10-CM

## 2021-09-15 DIAGNOSIS — Z3A19 19 weeks gestation of pregnancy: Secondary | ICD-10-CM

## 2021-09-15 DIAGNOSIS — F332 Major depressive disorder, recurrent severe without psychotic features: Secondary | ICD-10-CM

## 2021-09-15 DIAGNOSIS — O0932 Supervision of pregnancy with insufficient antenatal care, second trimester: Secondary | ICD-10-CM

## 2021-09-15 DIAGNOSIS — R4589 Other symptoms and signs involving emotional state: Secondary | ICD-10-CM

## 2021-09-15 DIAGNOSIS — E119 Type 2 diabetes mellitus without complications: Secondary | ICD-10-CM

## 2021-09-15 LAB — POCT URINALYSIS DIP (DEVICE)
Bilirubin Urine: NEGATIVE
Glucose, UA: NEGATIVE mg/dL
Hgb urine dipstick: NEGATIVE
Ketones, ur: NEGATIVE mg/dL
Leukocytes,Ua: NEGATIVE
Nitrite: NEGATIVE
Protein, ur: NEGATIVE mg/dL
Specific Gravity, Urine: 1.02 (ref 1.005–1.030)
Urobilinogen, UA: 0.2 mg/dL (ref 0.0–1.0)
pH: 6.5 (ref 5.0–8.0)

## 2021-09-15 MED ORDER — FLUOXETINE HCL 40 MG PO CAPS: 40.0000 mg | ORAL_CAPSULE | Freq: Every day | ORAL | 6 refills | Status: DC

## 2021-09-15 NOTE — Progress Notes (Signed)
Integrated Behavioral Health Initial In-Person Visit  MRN: [id number] Name: Sherry Campbell  Number of Integrated Behavioral Health Clinician visits:: 1/6 Session Start time: 3:00  Session End time: 3:49 Total time: 50  minutes  Types of Service: Individual psychotherapy, Health & Behavioral Assessment/Intervention, and Prevention  Interpretor:No. Interpretor Name and Language: /   Warm Hand Off Completed.  I reviewed patient visit with the [facility] Intern, and I concur with the treatment plan, as documented in the [facility] Intern note.   No charge for this visit due to [facility] Intern seeing patient.  Sherry Campbell, MSW, LCSW Integrated Behavioral Health Clinician Center for [facility] at [facility]         Subjective: Sherry Campbell is a 35 y.o. female accompanied by Spouse Patient was referred by Sherry Campbell, CNM for high PHQ-9, GAD-9, and C-SSRS Short. Patient reports the following symptoms/concerns: increased anxiety and depression Duration of problem: Duration of Pregnancy, but progression has worsen; Severity of problem: severe  Objective: Mood:  Calm   and Affect: Appropriate Risk of harm to self or others: Passive SI no intent or plan  HX of self-harm  Life Context: Family and Social: Marchia Campbell is a heavy support. Pt. mother has recently been diagnosed with breast cancer. 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  Self-Care: Heavily sleeping, but discussed mediation to relax and notice stressed in the body.  Life Changes: Recently bought a home.   Patient and/or Family's Strengths/Protective Factors: Social connections  Goals Addressed: Patient will: Reduce symptoms of: anxiety, depression, mood instability, and stress Increase knowledge and/or ability of: coping skills  Demonstrate ability to: Increase healthy adjustment to current life circumstances  Progress towards  Goals: Ongoing  Interventions: Interventions utilized: Motivational Interviewing, Mindfulness or Management consultant, CBT Cognitive Behavioral Therapy, and Sleep Hygiene  Standardized Assessments completed: C-SSRS Short  Patient and/or Family Response: Patient agrees to treatment plan.    Patient Centered Plan: Patient is on the following Treatment Plan(s):  Continue Behavioral health services, and send referral to [facility].    Assessment: Patient currently experiencing over-sleeping, irritability, anxiety, and depressed moods.   Patient may benefit from psychoeducation and brief therapeutic interventions regarding coping with symptoms of anxiety, depressive moods, and irritability .  Plan: Follow up with behavioral health clinician on : 10/05/2021 at 1:45pm Behavioral recommendations: healthy sleep hygiene, [facility], and meditation Referral(s): Integrated Art gallery [employer] (In Clinic) and [facility] (LME/Outside Clinic) "From scale of 1-10, how likely are you to follow plan?": /  Sherry Campbell

## 2021-09-15 NOTE — Progress Notes (Signed)
Patient was seen for type 2 diabetes in pregnancy self-management on 09/13/2021  Start time 1525 and End time 1615   Estimated due date: 02/09/22; [redacted]w[redacted]d  Clinical: Medications: metformin 1000 mg bid, levemir 20 u qhs, prenatal Medical History: reviewed Labs: A1c 5.5% 07/13/21; 7.6% 04/22/21  Dietary and Lifestyle History: Pt states she was diagnosed with type 2 diabetes in April. Pt reports she started Levemir 20 u qhs in August and has been taking 1000 mg metformin bid since April.  Pt states she has hypoglycemic symptoms in the 70s  Physical Activity: not assessed Stress:not assessed Sleep: 9 am - 4 pm  24 hr Recall: 5:30 pm First Meal:cereal, strawberries or hot pocket 9:00 pm Snack: carrtos & ranch OR protein bar 3:00 am Second meal:chicken, 1 c white rice, sauce, vegetables 7:00 am Snack: grapes, string cheese in car on way home from work Third meal: none 3:15 pm Snack: belvita Beverages: water  NUTRITION INTERVENTION  Nutrition education (E-1) on the following topics:   Initial Follow-up  [x]  []  Definition of Gestational Diabetes [x]  []  Why dietary management is important in controlling blood glucose [x]  []  Effects each nutrient has on blood glucose levels [x]  []  Simple carbohydrates vs complex carbohydrates [x]  []  Fluid intake [x]  []  Creating a balanced meal plan [x]  []  Carbohydrate counting  [x]  []  When to check blood glucose levels [x]  []  Proper blood glucose monitoring techniques [x]  []  Effect of stress and stress reduction techniques  [x]  []  Exercise effect on blood glucose levels, appropriate exercise during pregnancy [x]  []  Importance of limiting caffeine and abstaining from alcohol and smoking [x]  []  Medications used for blood sugar control during pregnancy [x]  []  Hypoglycemia and rule of 15 [x]  []  Postpartum self care  Patient already has a meter, is testing pre breakfast and 2 hours after each meal. FBS: 77-99 mg/dL mostly WNL Postprandial: 150-225  mg/dL  Patient instructed to monitor glucose levels: FBS: 60 - ? 95 mg/dL  1 hour: ? mg/dL 2 hour: ? mg/dL  Patient received handouts: Nutrition Diabetes and Pregnancy Carbohydrate Counting List  Patient will be seen for follow-up as needed.

## 2021-09-16 NOTE — Progress Notes (Signed)
History:   Sherry Campbell is a 35 y.o. G1P0 at 7w1dby LMP, early ultrasound being seen today for her first obstetrical visit. Patient does intend to breast feed. Pregnancy history fully reviewed - [facility]. 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 Was put on metformin and nightly levemir, but glucose readings borderline. Diabetes educator recommended switching to NPH BID but she would like to try diet and lifestyle changes first. Admits she has been very stressed and not eating appropriately due to mood changes and work schedule . Was on Prozac but told to stop due to pregnancy. Wants to start back on as soon as possible.   HISTORY: OB History  Gravida Para Term Preterm AB Living  1 0 0 0 0 0  SAB IAB Ectopic Multiple Live Births  0 0 0 0 0    # Outcome Date GA Lbr Len/2nd Weight Sex Delivery Anes PTL Lv  1 Current             Last pap smear was done 2022, need to confirm in records.  Past Medical History:  Diagnosis Date   Anxiety    Anxiety    Depression    Depression    Dysplasia of cervix, low grade (CIN 1)    Syncope    Past Surgical History:  Procedure Laterality Date   NO PAST SURGERIES     Family History  Problem Relation Age of Onset   Diabetes Mother    Hypertension Mother    Breast cancer Mother 574  Other Mother        BRCA2 mutation   Diabetes Father    Breast cancer Paternal Aunt        dx. 668s  Diabetes Maternal Grandmother    Diabetes Maternal Grandfather    Heart disease Maternal Grandfather    Diabetes Paternal Grandmother    Diabetes Paternal Grandfather    Bone cancer Paternal Grandfather        dx. 761s  Ovarian cancer Maternal Great-grandmother 554  Social History    Tobacco Use   Smoking status: Never   Smokeless tobacco: Never  Substance Use Topics   Alcohol use: Yes    Alcohol/week: 0.0 standard drinks    Comment: SOCIALLY   Drug use: No   No Known Allergies Current Outpatient Medications on File Prior to Visit  Medication Sig Dispense Refill   B-D ULTRAFINE III SHORT PEN 31G X 8 MM MISC Inject 1 each into the skin 3 (three) times daily.     LEVEMIR FLEXTOUCH 100 UNIT/ML FlexTouch Pen Inject into the skin.     metFORMIN (GLUCOPHAGE-XR) 500 MG 24 hr tablet Take 1,000 mg by mouth in the morning and at bedtime.     Prenatal MV & Min w/FA-DHA (PRENATAL ADULT GUMMY/DHA/FA PO) Take by mouth.     No current facility-administered medications on file prior to visit.   Review of Systems Pertinent items noted in HPI and remainder of comprehensive ROS otherwise negative. Physical Exam:   Vitals:   09/15/21 1415  BP: 107/65  Pulse: 88  Weight: 178 lb 4.8 oz (80.9 kg)   Fetal Heart Rate (bpm): 150  Constitutional: Well-developed, well-nourished pregnant female  in no acute distress.  HEENT: PERRLA Skin: normal color and turgor, no rash Cardiovascular: normal rate & rhythm, no murmur Respiratory: normal effort, lung sounds clear throughout GI: Abd soft, non-tender, pos BS x 4, gravid appropriate for gestational age MS: Extremities nontender, no edema, normal ROM Neurologic: Alert and oriented x 4.  GU: no CVA tenderness Pelvic: Exam deferred   Assessment & Plan:  1. Supervision of high risk pregnancy in second trimester - Doing physically well, starting to feel fetal movement - POCT urinalysis dip (device)  2. [redacted] weeks gestation of pregnancy - Routine OB care   3. Type 2 diabetes mellitus without complication, without long-term current use of insulin (HCC) - Reviewed eating habits and brainstormed ways to increase intake despite schedule  4. Depressed mood - Reviewed risks/benefits, validated feelings and desire to be back on meds for  mood stabilization - FLUoxetine (PROZAC) 40 MG capsule; Take 1 capsule (40 mg total) by mouth daily.  Dispense: 30 capsule; Refill: 6  5. Initial obstetric visit in second trimester - Previous prenatal partially reviewed - Continue prenatal vitamins. - Problem list reviewed and updated. - Genetic Screening discussed, First trimester screen, Quad screen, and NIPS:  previously declined, NT normal . - Ultrasound discussed; fetal anatomic survey: ordered. - Anticipatory guidance about prenatal visits given including labs, ultrasounds, and testing. - Discussed usage of Babyscripts and virtual visits as additional source of managing and completing prenatal visits in midst of coronavirus and pandemic.   - Encouraged to complete MyChart Registration for her ability to review results, send requests, and have questions addressed.  - The nature of [place] for [facility] Healthcare/Faculty Practice with multiple MDs and Advanced Practice Providers was explained to patient; also emphasized that residents, students are part of our team. - Routine obstetric precautions reviewed. Encouraged to seek out care at office or emergency room [facility] preferred) for urgent and/or emergent concerns.  Follow up in 4wks for in-person HOB.  Gaylan Gerold, MSN, CNM, [place] Certified Nurse Midwife, [facility]

## 2021-09-19 ENCOUNTER — Ambulatory Visit: Payer: Self-pay | Admitting: Genetic Counselor

## 2021-09-19 DIAGNOSIS — Z8481 Family history of carrier of genetic disease: Secondary | ICD-10-CM

## 2021-09-19 NOTE — Progress Notes (Signed)
HPI:   Sherry Campbell was previously seen in the [place] clinic due to a family history of a BRCA2 gene mutation and concerns regarding a hereditary predisposition to cancer. Please refer to our prior cancer genetics clinic note for more information regarding our discussion, assessment and recommendations, at the time. Sherry Campbell recent genetic test results were disclosed to her, as were recommendations warranted by these results. These results and recommendations are discussed in more detail below.  CANCER HISTORY:  Oncology History   No history exists.    FAMILY HISTORY:  We obtained a detailed, 4-generation family history.  Significant diagnoses are listed below:        Family History  Problem Relation Age of Onset   Breast cancer Mother 18   Other Mother          BRCA2 mutation   Breast cancer Paternal Aunt          dx. 79s   Bone cancer Paternal Grandfather          dx. 58s   Ovarian cancer Maternal Great-grandmother 65           [name]'s mother was diagnosed with breast cancer at 12 and her genetic testing found a BRCA2 mutation. Her maternal great grandmother was diagnosed with ovarian cancer at 8, she is deceased.    [name] paternal aunt was diagnosed with breast cancer in her 35s. Her paternal grandfather was diagnosed with brain cancer in his 69s and her paternal grandfather's sister was diagnosed with ovarian cancer at an unknown age. There no reported Ashkenazi Jewish ancestry.   GENETIC TEST RESULTS:  [name] did NOT inherit the BRCA2 mutation identified in her mother. Therefore, this is a negative result.    The test report has been scanned into EPIC and is located under the Molecular Pathology section of the Results Review tab.  A portion of the result report is included below for reference. Genetic testing reported out on 09/02/2021.         [name] test was normal and did not reveal the familial mutation. We call this result a true  negative result because the cancer-causing mutation was identified in [name]'s family, and she did not inherit it. Given this negative result, [name]'s chances of developing BRCA2-related cancers are the same as they are in the general population. She is recommended to begin annual mammograms at age 44.    Our contact number was provided. [name] questions were answered to her satisfaction, and she knows she is welcome to call us at anytime with additional questions or concerns.    Lucille Passy, MS, [facility] Genetic Counselor [email or web address] (P) [phone]

## 2021-09-23 ENCOUNTER — Encounter: Payer: Self-pay | Admitting: *Deleted

## 2021-09-23 ENCOUNTER — Other Ambulatory Visit: Payer: Self-pay | Admitting: *Deleted

## 2021-09-23 ENCOUNTER — Ambulatory Visit: Payer: Self-pay | Attending: Obstetrics and Gynecology | Admitting: Obstetrics and Gynecology

## 2021-09-23 ENCOUNTER — Ambulatory Visit: Payer: Self-pay | Admitting: *Deleted

## 2021-09-23 ENCOUNTER — Other Ambulatory Visit: Payer: Self-pay

## 2021-09-23 ENCOUNTER — Ambulatory Visit: Payer: Self-pay | Attending: Certified Nurse Midwife

## 2021-09-23 VITALS — BP 116/66 | HR 75

## 2021-09-23 DIAGNOSIS — Z3A20 20 weeks gestation of pregnancy: Secondary | ICD-10-CM

## 2021-09-23 DIAGNOSIS — O0990 Supervision of high risk pregnancy, unspecified, unspecified trimester: Secondary | ICD-10-CM | POA: Insufficient documentation

## 2021-09-23 DIAGNOSIS — O24112 Pre-existing diabetes mellitus, type 2, in pregnancy, second trimester: Secondary | ICD-10-CM

## 2021-09-23 DIAGNOSIS — E119 Type 2 diabetes mellitus without complications: Secondary | ICD-10-CM | POA: Insufficient documentation

## 2021-09-23 DIAGNOSIS — Z362 Encounter for other antenatal screening follow-up: Secondary | ICD-10-CM

## 2021-09-23 NOTE — Progress Notes (Signed)
Maternal-Fetal Medicine   Name: Sherry Campbell DOB: Aug 18, 1986 MRN: [id number] Referring Provider: Edd Arbour, CNM  I had the pleasure of seeing Sherry Campbell today at the [facility]. She is G1 P0 at 20-weeks' gestation and is here for fetal anatomy scan. Past medical history significant for diagnosis of type 2 diabetes.  In April 2022, she complained of blurry vision and on blood work, her hemoglobin A1c was 10.4%.  Patient took metformin for control.  Ophthalmological examination showed no evidence of proliferative retinopathy.  She does not have symptoms of neuropathies.  No evidence of nephropathy.  Her most recent hemoglobin A1C in August 2022 was 5.5%. Patient takes Levemir insulin 20 units at night.  She reports her fasting levels are between 90 and 105 mg/DL and postprandial levels range between 100 255 mg/DL. No history of hypertension or thyroid disorders. Past surgical history: Nil of note. Medications: Prenatal vitamins.  She will be starting low-dose aspirin. Allergies: No known drug allergies. Social history: Denies tobacco or drug or alcohol use.  She is engaged to be married and her partner is in good health.  He is African-American.  Patient reports she is Hispanic and both her parents are from [place].  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 Family history: No history of venous thromboembolism in the family. Ultrasound We performed fetal anatomical survey.  Amniotic fluid is normal and good fetal activity seen.  Fetal biometry is consistent with the previously established dates.  No markers of aneuploidies or fetal structural defects are seen. Our concerns include Type 2 diabetes in pregnancy Based on high hemoglobin A1C in April 2022, the patient has type 2 diabetes.  I discussed the importance of good control of diabetes to prevent adverse fetal and neonatal outcomes. Miscarriages are increased.  Fetal congenital malformations are  increased in pregnancies complicated by diabetes.  Based on her hemoglobin A1c of 5.5% in August 2022, it is likely that her hemoglobin A1c had improved in the [place] conception.  However, fetal congenital malformations are slightly increased even if diabetes is well controlled.   Other complications include fetal macrosomia leading shoulder dystocia or neurological injuries at birth (Erb's palsy). Neonatal complications including respiratory-distress syndrome, hypoglycemia can also occur. In poorly-controlled diabetes, the likelihood of stillbirth is increased.  I explained the normal parameters of blood glucose levels and encouraged her to regularly check her blood glucose levels.  I encouraged the patient to gradually increase Levemir or insulin.  If postprandial levels are persistently high, she may require short acting insulin with meals.  We discussed hypoglycemia and corrective measures.  Patient understands that diet and exercise improve control of diabetes.  I discussed ultrasound protocol. Serial fetal growth assessments and weekly antenatal testing beginning at 32 weeks till delivery will be performed.  Delivery is recommended at 39 weeks if diabetes is well controlled. Early term delivery (37 to 39 weeks) may be recommended if poor control is seen.  I discussed the benefit of low-dose aspirin in reducing the likelihood of developing preeclampsia. I recommended aspirin 81 mg to be taken daily from 13 weeks' gestation until 39 weeks. Patient does not have contraindications to aspirin.  I recommended fetal echocardiography.  Recommendations -An appointment was made for her to return in 4 weeks to complete fetal anatomical survey. -We have requested an appointment for fetal echocardiography [facility] [place]). -Fetal growth assessments every 4 weeks till delivery. -Weekly BPP from [redacted] weeks gestation till delivery. -Low-dose aspirin prophylaxis.  Thank you for consultation.  If you  have any questions or concerns, please contact me the [facility].  Consultation including face-to-face (more than 50%) counseling 30 minutes.

## 2021-10-05 ENCOUNTER — Other Ambulatory Visit: Payer: Self-pay

## 2021-10-05 ENCOUNTER — Ambulatory Visit: Payer: Self-pay | Admitting: Clinical

## 2021-10-05 DIAGNOSIS — F332 Major depressive disorder, recurrent severe without psychotic features: Secondary | ICD-10-CM

## 2021-10-05 NOTE — BH Specialist Note (Signed)
Integrated Behavioral Health Follow Up In-Person Visit  MRN: [id number] Name: Sherry Campbell  Number of Integrated Behavioral Health Clinician visits: 2/6 Session Start time: 1:50  Session End time: 2:45 Total time: 55  minutes  I reviewed patient visit with the [facility] Intern, and I concur with the treatment plan, as documented in the [facility] Intern note.   No charge for this visit due to [facility] Intern seeing patient.  Hulda Marin, MSW, LCSW Integrated Behavioral Health Clinician Center for [facility] at [facility]   Types of Service: Individual psychotherapy, Collaborative care, and Prevention  Interpretor:No. Interpretor Name and Language: n/a  Subjective: Sherry Campbell is a 35 y.o. female accompanied by self Patient was referred by Edd Arbour, CNM for anxiety during pregnancy. Patient reports the following symptoms/concerns: increased anxiety and depression since pregnancy, but has stabilized since retuning to medication. Duration of problem: Duration of pregnancy; Severity of problem: moderate  Objective: Mood:  Normal  and Affect: Appropriate Risk of harm to self or others: No plan to harm self or others  Life Context: Family and Social: Family holds a large impact on the pt in a positive way. Managing anxiety has allowed pt to become more commutative with FOB.  School/Work: Pt. Has stress while deciding on best career choices to make after the baby arrives.  Self-Care: Pt. Is working on healthier sleeping habits; sleeping when tired rather then to avoid.  Life Changes: Pt. Recently bought a home with fiance/FOB. Pt mother has recently been diagnosed with breast cancer, and pt feels guilty for not helping mother during chemo due to pregnancy.   Patient and/or Family's Strengths/Protective Factors: Social connections and Sense of purpose  Goals Addressed: Patient will:  Reduce symptoms of: anxiety, depression, mood instability, and stress    Increase knowledge and/or ability of: coping skills, self-management skills, and stress reduction   Demonstrate ability to: Increase healthy adjustment to current life circumstances and Improve medication compliance  Progress towards Goals: Ongoing  Interventions: Interventions utilized:  Mindfulness or Management consultant, CBT Cognitive Behavioral Therapy, Supportive Counseling, and Sleep Hygiene Standardized Assessments completed: Not Needed  Patient and/or Family Response: Pt agrees to treatment plan.   Patient Centered Plan: Patient is on the following Treatment Plan(s): Continue to take medication as prescribed by doctor.  Assessment: Patient currently experiencing life-stress, anxiety for giving-birth, and managing healthy daily roles.   Patient may benefit from therapeutic services to continue working on stress-reduction.  Plan: Follow up with behavioral health clinician on : In one-month, will schedule when pt receives next months work schedule.  Behavioral recommendations: stress-reduction and continuing medication reunient  Referral(s): Integrated [employer] (In Clinic) "From scale of 1-10, how likely are you to follow plan?": /  Clelia Croft

## 2021-10-10 NOTE — Progress Notes (Addendum)
   PRENATAL VISIT NOTE  Subjective:  Sherry Campbell is a 35 y.o. G1P0 at [redacted]w[redacted]d being seen today for ongoing prenatal care.  She is currently monitored for the following issues for this high-risk pregnancy and has Dysplasia of cervix, low grade (CIN 1); Anxiety; Depression; Family history of BRCA2 gene positive; Family history of breast cancer; Type 2 diabetes mellitus without complication, without long-term current use of insulin ([place]); and Supervision of high risk pregnancy, antepartum on their problem list.  Patient reports no complaints.  Contractions: Not present. Vag. Bleeding: None.  Movement: Present. Denies leaking of fluid.   The following portions of the patient's history were reviewed and updated as appropriate: allergies, current medications, past family history, past medical history, past social history, past surgical history and problem list.   Objective:   Vitals:   10/12/21 1558  BP: (!) 115/59  Pulse: 85  Weight: 178 lb 11.2 oz (81.1 kg)    Fetal Status: Fetal Heart Rate (bpm): 130   Movement: Present     General:  Alert, oriented and cooperative. Patient is in no acute distress.  Skin: Skin is warm and dry. No rash noted.   Cardiovascular: Normal heart rate noted  Respiratory: Normal respiratory effort, no problems with respiration noted  Abdomen: Soft, gravid, appropriate for gestational age.  Pain/Pressure: Present     Pelvic: Cervical exam deferred        Extremities: Normal range of motion.  Edema: None  Mental Status: Normal mood and affect. Normal behavior. Normal judgment and thought content.   Assessment and Plan:  Pregnancy: G1P0 at [redacted]w[redacted]d 1. Type 2 diabetes mellitus without complication, without long-term current use of insulin (HCC) - Levemir 20 QHS, Metformin -- she self titrated the levemir to 25 units and fastings imrpoved ot 70-80s. She still has trouble with meals despite keeping meals consistent. She would like to meet with Levada Dy to learn about carb  counting and dosing meal time insulin. Scheduled for 11/17.  - Normal growth and anatomy although incomplete on 10/21. 11/21 is her f/u US.  - August A1C was 5.5%  - Normal optho exam, fetal echo scheduled for 11/28 - Continue ldASA  - Discussed BPP/NST at 32w until delivery  2. Supervision of high risk pregnancy, antepartum - s/p flu shot at work - Discussed birth control - she thinks she would like to do the POP. She had LNG iud in the past but had cysts.  - Discussed 28 wks labs next time - doesn't need 2 hr gtt.  - Her mom was diagnosed with breast cancer (BRCA2 carrier - pt is negative) and is on Chemo. She doesn't know regimen but will let me know to ensure no safety concerns for her.   Preterm labor symptoms and general obstetric precautions including but not limited to vaginal bleeding, contractions, leaking of fluid and fetal movement were reviewed in detail with the patient. Please refer to After Visit Summary for other counseling recommendations.   Return in about 4 weeks (around 11/09/2021) for Schedule virtual visit with Levada Dy please.  Future Appointments  Date Time Provider [place]  10/20/2021  8:15 AM [facility]  10/24/2021  1:00 PM [facility] NURSE [facility]  10/24/2021  1:15 PM [facility] US2 [facility]  11/14/2021  3:55 PM Woodroe Mode, MD [facility]    Radene Gunning, MD

## 2021-10-12 ENCOUNTER — Ambulatory Visit (INDEPENDENT_AMBULATORY_CARE_PROVIDER_SITE_OTHER): Payer: Self-pay | Admitting: Obstetrics and Gynecology

## 2021-10-12 ENCOUNTER — Other Ambulatory Visit: Payer: Self-pay

## 2021-10-12 VITALS — BP 115/59 | HR 85 | Wt 178.7 lb

## 2021-10-12 DIAGNOSIS — Z803 Family history of malignant neoplasm of breast: Secondary | ICD-10-CM

## 2021-10-12 DIAGNOSIS — O0990 Supervision of high risk pregnancy, unspecified, unspecified trimester: Secondary | ICD-10-CM

## 2021-10-12 DIAGNOSIS — Z8481 Family history of carrier of genetic disease: Secondary | ICD-10-CM

## 2021-10-12 DIAGNOSIS — E119 Type 2 diabetes mellitus without complications: Secondary | ICD-10-CM

## 2021-10-12 NOTE — Progress Notes (Signed)
Pt states has not been logging glucose level, gave log sheet. States will put readings in My Chart. Pt also states mother is going thru Chemotherapy & wants to know if she should be concerned about chemo.

## 2021-10-20 ENCOUNTER — Telehealth (INDEPENDENT_AMBULATORY_CARE_PROVIDER_SITE_OTHER): Payer: Self-pay | Admitting: Registered"

## 2021-10-20 DIAGNOSIS — Z794 Long term (current) use of insulin: Secondary | ICD-10-CM

## 2021-10-20 DIAGNOSIS — E119 Type 2 diabetes mellitus without complications: Secondary | ICD-10-CM

## 2021-10-20 MED ORDER — INSULIN LISPRO 100 UNIT/ML IJ SOLN
10.0000 [IU] | Freq: Three times a day (TID) | INTRAMUSCULAR | 11 refills | Status: DC
Start: 2021-10-20 — End: 2021-11-14

## 2021-10-20 MED ORDER — LEVEMIR FLEXTOUCH 100 UNIT/ML ~~LOC~~ SOPN: 20.0000 [IU] | PEN_INJECTOR | Freq: Every day | SUBCUTANEOUS | 1 refills | Status: DC

## 2021-10-20 NOTE — Progress Notes (Addendum)
Virtual Visit via Video Note  I connected with Sherry Campbell on 10/20/21 at  8:15 AM EST by a video enabled telemedicine application and verified that I am speaking with the correct person using two identifiers.  Location: Patient: home Provider: Therapist, [facility],    I discussed the limitations of evaluation and management by telemedicine and the availability of in person appointments. The patient expressed understanding and agreed to proceed.  History of Present Illness: Type 2 Diabetes in pregnancy. EDD 03/01/22; [redacted]w[redacted]d. Initial visit with Diabetes educator 09/13/21 for general education.   Observations/Objective:  Current medications:  Metformin 1000 mg bid (patient taking as directed) Levemir 20 units qhs (patient increased to 25 units due to elevated FBS)  SMBG: FBS 70s-80s occasionally above 100; 2-hr PP ~140, occasionally up to 180 mg/dL  Since increasing Levemir pt states FBS are mostly WNL, hypoglycemic sxs once, did not check blood sugar. Pt reports she treats sxs with crackers or juice, will have a meal 1-2 hours later.  Patient concerned about elevated PPBG and has been conscious of carb intake. Sample breakfast eaten 2x with different 2-hr PPBG: Breakfast burrito (usually 40 g carb breakfast) FBS 97 & 113 mg/dL followed by 2-hr PP 357 & 169 mg/dL Rise after meal 42 & 56 mg/dL  Physical activity: walking at work during 12 hr shifts 3x/week. Not as much activity on days off.  3x week works nights, sleep schedule is erratic.Usually takes FBS after waking up, sometimes just waits until she hasn't eaten for 8 hours while awake sometimes when switching from working nights to days off.  Assessment and Plan:  Discussed information with Dr. Shawnie Pons and insulin has been adjusted to:  10 units Levemir bid 10 units Humalog tid with meals  Updated Rx instructions sent to patient via MyChart Continue with 1000 mg metformin bid  Follow Up Instructions:   I  discussed the assessment and treatment plan with the patient. The patient was provided an opportunity to ask questions and all were answered. The patient agreed with the plan and demonstrated an understanding of the instructions.   The patient was advised to call back or seek an in-person evaluation if the symptoms worsen or if the condition fails to improve as anticipated.  I provided 30 minutes of non-face-to-face time during this encounter.  Heywood Bene, RD, LDN, CDCES

## 2021-10-24 ENCOUNTER — Encounter: Payer: Self-pay | Admitting: *Deleted

## 2021-10-24 ENCOUNTER — Ambulatory Visit: Payer: 59 | Attending: Obstetrics and Gynecology

## 2021-10-24 ENCOUNTER — Other Ambulatory Visit: Payer: Self-pay

## 2021-10-24 ENCOUNTER — Other Ambulatory Visit: Payer: Self-pay | Admitting: *Deleted

## 2021-10-24 ENCOUNTER — Ambulatory Visit: Payer: 59 | Admitting: *Deleted

## 2021-10-24 VITALS — BP 114/62 | HR 76

## 2021-10-24 DIAGNOSIS — O0990 Supervision of high risk pregnancy, unspecified, unspecified trimester: Secondary | ICD-10-CM | POA: Diagnosis present

## 2021-10-24 DIAGNOSIS — Z3A24 24 weeks gestation of pregnancy: Secondary | ICD-10-CM

## 2021-10-24 DIAGNOSIS — O24112 Pre-existing diabetes mellitus, type 2, in pregnancy, second trimester: Secondary | ICD-10-CM

## 2021-10-24 DIAGNOSIS — O24113 Pre-existing diabetes mellitus, type 2, in pregnancy, third trimester: Secondary | ICD-10-CM

## 2021-10-24 DIAGNOSIS — Z362 Encounter for other antenatal screening follow-up: Secondary | ICD-10-CM

## 2021-11-14 ENCOUNTER — Other Ambulatory Visit: Payer: Self-pay | Admitting: Obstetrics & Gynecology

## 2021-11-14 ENCOUNTER — Other Ambulatory Visit: Payer: Self-pay

## 2021-11-14 ENCOUNTER — Ambulatory Visit (INDEPENDENT_AMBULATORY_CARE_PROVIDER_SITE_OTHER): Payer: 59 | Admitting: Obstetrics & Gynecology

## 2021-11-14 VITALS — BP 120/62 | HR 79 | Wt 182.6 lb

## 2021-11-14 DIAGNOSIS — O0990 Supervision of high risk pregnancy, unspecified, unspecified trimester: Secondary | ICD-10-CM

## 2021-11-14 DIAGNOSIS — E119 Type 2 diabetes mellitus without complications: Secondary | ICD-10-CM

## 2021-11-14 MED ORDER — LEVEMIR FLEXTOUCH 100 UNIT/ML ~~LOC~~ SOPN: 10.0000 [IU] | PEN_INJECTOR | Freq: Two times a day (BID) | SUBCUTANEOUS | 1 refills | Status: DC

## 2021-11-14 MED ORDER — INSULIN LISPRO 100 UNIT/ML IJ SOLN: 10.0000 [IU] | Freq: Three times a day (TID) | INTRAMUSCULAR | 11 refills | Status: DC

## 2021-11-14 NOTE — Progress Notes (Signed)
   PRENATAL VISIT NOTE  Subjective:  Sherry Campbell is a 35 y.o. G1P0 at 45w4dbeing seen today for ongoing prenatal care.  She is currently monitored for the following issues for this high-risk pregnancy and has Dysplasia of cervix, low grade (CIN 1); Anxiety; Depression; Family history of BRCA2 gene positive; Family history of breast cancer; Type 2 diabetes mellitus without complication, without long-term current use of insulin ([place]; and Supervision of high risk pregnancy, antepartum on their problem list.  Patient reports no complaints.  Contractions: Not present. Vag. Bleeding: None.  Movement: Present. Denies leaking of fluid.   The following portions of the patient's history were reviewed and updated as appropriate: allergies, current medications, past family history, past medical history, past social history, past surgical history and problem list.   Objective:   Vitals:   11/14/21 1633  BP: 120/62  Pulse: 79  Weight: 182 lb 9.6 oz (82.8 kg)    Fetal Status: Fetal Heart Rate (bpm): 140   Movement: Present     General:  Alert, oriented and cooperative. Patient is in no acute distress.  Skin: Skin is warm and dry. No rash noted.   Cardiovascular: Normal heart rate noted  Respiratory: Normal respiratory effort, no problems with respiration noted  Abdomen: Soft, gravid, appropriate for gestational age.  Pain/Pressure: Absent     Pelvic: Cervical exam deferred        Extremities: Normal range of motion.  Edema: None  Mental Status: Normal mood and affect. Normal behavior. Normal judgment and thought content.   Assessment and Plan:  Pregnancy: G1P0 at 264w4d. Type 2 diabetes mellitus without complication, without long-term current use of insulin (HCC) States FBS controlled but needs to add meal coverage d/t PP 150-170 - insulin lispro (HUMALOG) 100 UNIT/ML injection; Inject 0.1 mLs (10 Units total) into the skin 3 (three) times daily before meals.  Dispense: 10 mL; Refill: 11 -  LEVEMIR FLEXTOUCH 100 UNIT/ML FlexTouch Pen; Inject 10 Units into the skin 2 (two) times daily.  Dispense: 15 mL; Refill: 1  2. Supervision of high risk pregnancy, antepartum F/u [place] week  Preterm labor symptoms and general obstetric precautions including but not limited to vaginal bleeding, contractions, leaking of fluid and fetal movement were reviewed in detail with the patient. Please refer to After Visit Summary for other counseling recommendations.   Return in about 2 weeks (around 11/28/2021).  Future Appointments  Date Time Provider [place]12/20/2022 12:45 PM [facility]12/20/2022  1:00 PM [facility] US[facility]  JaEmeterio ReeveMD

## 2021-11-17 ENCOUNTER — Encounter: Payer: Self-pay | Admitting: Obstetrics & Gynecology

## 2021-11-17 DIAGNOSIS — E119 Type 2 diabetes mellitus without complications: Secondary | ICD-10-CM

## 2021-11-21 MED ORDER — INSULIN LISPRO (1 UNIT DIAL) 100 UNIT/ML (KWIKPEN): 10.0000 [IU] | PEN_INJECTOR | Freq: Three times a day (TID) | SUBCUTANEOUS | 11 refills | Status: DC

## 2021-11-22 ENCOUNTER — Other Ambulatory Visit: Payer: Self-pay

## 2021-11-22 ENCOUNTER — Other Ambulatory Visit: Payer: Self-pay | Admitting: *Deleted

## 2021-11-22 ENCOUNTER — Ambulatory Visit: Payer: 59 | Attending: Obstetrics and Gynecology

## 2021-11-22 ENCOUNTER — Ambulatory Visit: Payer: 59 | Admitting: *Deleted

## 2021-11-22 VITALS — BP 106/53 | HR 83

## 2021-11-22 DIAGNOSIS — E119 Type 2 diabetes mellitus without complications: Secondary | ICD-10-CM | POA: Diagnosis not present

## 2021-11-22 DIAGNOSIS — O0990 Supervision of high risk pregnancy, unspecified, unspecified trimester: Secondary | ICD-10-CM

## 2021-11-22 DIAGNOSIS — O24113 Pre-existing diabetes mellitus, type 2, in pregnancy, third trimester: Secondary | ICD-10-CM

## 2021-11-22 DIAGNOSIS — Z362 Encounter for other antenatal screening follow-up: Secondary | ICD-10-CM | POA: Diagnosis not present

## 2021-11-22 DIAGNOSIS — Z3A28 28 weeks gestation of pregnancy: Secondary | ICD-10-CM | POA: Diagnosis not present

## 2021-11-23 ENCOUNTER — Other Ambulatory Visit: Payer: Self-pay | Admitting: General Practice

## 2021-11-23 MED ORDER — NOVOLOG FLEXPEN 100 UNIT/ML ~~LOC~~ SOPN: 10.0000 [IU] | PEN_INJECTOR | Freq: Three times a day (TID) | SUBCUTANEOUS | 6 refills | Status: DC

## 2021-11-29 ENCOUNTER — Encounter: Payer: 59 | Admitting: Family Medicine

## 2021-12-01 ENCOUNTER — Ambulatory Visit (INDEPENDENT_AMBULATORY_CARE_PROVIDER_SITE_OTHER): Payer: 59 | Admitting: Obstetrics & Gynecology

## 2021-12-01 ENCOUNTER — Encounter: Payer: 59 | Attending: Certified Nurse Midwife | Admitting: Registered"

## 2021-12-01 ENCOUNTER — Ambulatory Visit: Payer: 59 | Admitting: Registered"

## 2021-12-01 ENCOUNTER — Encounter: Payer: 59 | Admitting: Obstetrics & Gynecology

## 2021-12-01 ENCOUNTER — Other Ambulatory Visit: Payer: Self-pay

## 2021-12-01 VITALS — BP 113/72 | HR 89 | Wt 184.0 lb

## 2021-12-01 DIAGNOSIS — O24113 Pre-existing diabetes mellitus, type 2, in pregnancy, third trimester: Secondary | ICD-10-CM

## 2021-12-01 DIAGNOSIS — O24119 Pre-existing diabetes mellitus, type 2, in pregnancy, unspecified trimester: Secondary | ICD-10-CM | POA: Insufficient documentation

## 2021-12-01 DIAGNOSIS — O0990 Supervision of high risk pregnancy, unspecified, unspecified trimester: Secondary | ICD-10-CM

## 2021-12-01 NOTE — Progress Notes (Signed)
Patient was seen on 12/01/21 for follow-up assessment and education for Gestational Diabetes. EDD 02/09/22; [redacted]w[redacted]d.   Patient is testing blood glucose as directed pre breakfast and 2 hours after each meal.  Medication: Levemir 10 u bid; Humalog 10 u with meals  Pt c/o of hypoglycemic sxs at 4 hr postprandial. Pt states she has experimented and is getting better.  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) FBS 97 mg/dL followed by B: veg soup with rice (starchy veg included potatoes, corn) ~60 g cho 2 hr PPBG 119 mg/dL 4 hr PPBG 76 with hypoglycemic sxs  Day 2 FBS 107 (B: hot pocket 45 g cho)  2 hr PPBG 130 4 hr PPBG 79 with hypoglycemic sxs  The following learning objectives reviewed during follow-up visit:  Fat and protein eaten before carbs may help create more stable rise and fall of blood sugar Potential alternatives to checking blood sugar; freestyle VS Dexcom Potential use of Omnipod insulin pump  Plan:  Apply Dexcom sample tonight Look for email invitation to share blood sugar data Call insurance companies to check coverage for CGM & Pump  Patient instructed to monitor glucose levels: FBS: 60 - 95 mg/dl 2 hour: <562 mg/dl  Patient received the following handouts: Dexcom G6 sample Lot #[id number] Omnipod DASH brochure  Patient will be seen for follow-up in 1 weeks or as needed.

## 2021-12-01 NOTE — Progress Notes (Signed)
° °  PRENATAL VISIT NOTE  Subjective:  Sherry Campbell is a 35 y.o. G1P0 at [redacted]w[redacted]d being seen today for ongoing prenatal care.  She is currently monitored for the following issues for this high-risk pregnancy and has Dysplasia of cervix, low grade (CIN 1); Anxiety; Depression; Family history of BRCA2 gene positive; Family history of breast cancer; Type 2 diabetes mellitus without complication, without long-term current use of insulin ([place]); Supervision of high risk pregnancy, antepartum; and Pre-existing type 2 diabetes mellitus in pregnancy in third trimester on their problem list.  Patient reports no complaints.   .  .  Movement: Present. Denies leaking of fluid.   The following portions of the patient's history were reviewed and updated as appropriate: allergies, current medications, past family history, past medical history, past social history, past surgical history and problem list.   Objective:   Vitals:   12/01/21 1522  BP: 113/72  Pulse: 89  Weight: 184 lb (83.5 kg)    Fetal Status: Fetal Heart Rate (bpm): 125   Movement: Present     General:  Alert, oriented and cooperative. Patient is in no acute distress.  Skin: Skin is warm and dry. No rash noted.   Cardiovascular: Normal heart rate noted  Respiratory: Normal respiratory effort, no problems with respiration noted  Abdomen: Soft, gravid, appropriate for gestational age.  Pain/Pressure: Absent     Pelvic: Cervical exam deferred        Extremities: Normal range of motion.  Edema: None  Mental Status: Normal mood and affect. Normal behavior. Normal judgment and thought content.   Assessment and Plan:  Pregnancy: G1P0 at [redacted]w[redacted]d 1. Pre-existing type 2 diabetes mellitus in pregnancy in third trimester  reviewed BG today with A. Johnson note reviewed, will start Dexcom  2. Supervision of high risk pregnancy, antepartum   Preterm labor symptoms and general obstetric precautions including but not limited to vaginal bleeding,  contractions, leaking of fluid and fetal movement were reviewed in detail with the patient. Please refer to After Visit Summary for other counseling recommendations.   Return in about 2 weeks (around 12/15/2021).  Future Appointments  Date Time Provider [place]  12/23/2021  2:30 PM [facility] NURSE [facility]  12/23/2021  2:45 PM [facility] US5 [facility]  12/28/2021 10:15 AM [facility] NST [facility]  01/04/2022 10:15 AM [facility] NST [facility]  01/11/2022 11:15 AM [facility] NST [facility] [place]    Emeterio Reeve, MD

## 2021-12-04 NOTE — L&D Delivery Note (Addendum)
OB/GYN Faculty Practice Delivery Note & Post Placental IUD placement  Sherry Campbell is a 36 y.o. G1P0 s/p SVD at [redacted]w[redacted]d. She was admitted for IOL for T2DM.   ROM: 2330(9 hours) with clear fluid GBS Status: negative Maximum Maternal Temperature: 98.9  Labor Progress: Presented for IOL, received two cytotecs and a foley balloon. Once her foley balloon was out she was started on pitocin and then AROMed. Ultimately she progressed to complete.   Delivery Date/Time: [phone] on 01/20/2022 Delivery: Called to room and patient was complete and pushing. Head delivered OA. No nuchal cord present. At that time anterior shoulder was found to be behind the pelvic bone and therefore there was a dystocia which was relieved with McRoberts as well as delivery of posterior arm. The body delivered in usual fashion. Infant with spontaneous cry, placed on mother's abdomen, dried and stimulated. Cord clamped x 2 after 1-minute delay, and cut by father of baby. Cord blood drawn. Placenta delivered spontaneously with gentle cord traction. Patient's IV came out immediately prior to delivery and therefore she received IM pitocin immediately after delivery.  At that time patient was given IV Fentanyl for pain control. A manual sweep was done of lower uterine segment to remove blood clots. At that time a post placental liletta was inserted manually at the top of the fundus. The strings were trimmed immediately inside the introitus.  Fundus firm with massage and Pitocin. Labia, perineum, vagina, and cervix inspected and found to have a first degree laceration which was repaired with 3-0 vicryl. Additionally there was also a left periurethral laceration which was repaired with 4-0 Monocryl.  Placenta: intact, 3V cord, L&D Complications: None Lacerations: 1st degree perineal and left periurethral EBL: 100cc Analgesia: Nitrous, IV fentanyl    Infant: female   APGARs 9,10   weight pending  Warner Mccreedy, MD, MPH OB Fellow,  Faculty Practice [facility] for [facility], [facility] 01/20/2022, 9:27 AM

## 2021-12-12 ENCOUNTER — Other Ambulatory Visit: Payer: Self-pay

## 2021-12-13 ENCOUNTER — Other Ambulatory Visit: Payer: Self-pay

## 2021-12-13 ENCOUNTER — Ambulatory Visit (INDEPENDENT_AMBULATORY_CARE_PROVIDER_SITE_OTHER): Payer: Self-pay | Admitting: Obstetrics and Gynecology

## 2021-12-13 ENCOUNTER — Other Ambulatory Visit (HOSPITAL_COMMUNITY)
Admission: RE | Admit: 2021-12-13 | Discharge: 2021-12-13 | Disposition: A | Discharge status: Home / Self Care | Payer: 59 | Source: Ambulatory Visit | Attending: Obstetrics and Gynecology | Admitting: Obstetrics and Gynecology

## 2021-12-13 VITALS — BP 106/64 | HR 80 | Wt 183.0 lb

## 2021-12-13 DIAGNOSIS — N898 Other specified noninflammatory disorders of vagina: Secondary | ICD-10-CM

## 2021-12-13 DIAGNOSIS — O26893 Other specified pregnancy related conditions, third trimester: Secondary | ICD-10-CM | POA: Diagnosis present

## 2021-12-13 DIAGNOSIS — O0990 Supervision of high risk pregnancy, unspecified, unspecified trimester: Secondary | ICD-10-CM

## 2021-12-13 DIAGNOSIS — O24113 Pre-existing diabetes mellitus, type 2, in pregnancy, third trimester: Secondary | ICD-10-CM

## 2021-12-13 NOTE — Progress Notes (Signed)
° ° °  PRENATAL VISIT NOTE  Subjective:  Sherry Campbell is a 36 y.o. G1 at [redacted]w[redacted]d being seen today for ongoing prenatal care.  She is currently monitored for the following issues for this high-risk pregnancy and has Anxiety; Depression; Family history of BRCA2 gene positive; Family history of breast cancer; Type 2 diabetes mellitus without complication, without long-term current use of insulin ([place]); Supervision of high risk pregnancy, antepartum; and Pre-existing type 2 diabetes mellitus in pregnancy in third trimester on their problem list.  Patient reports no complaints.  Contractions: Not present. Vag. Bleeding: None.  Movement: Present. Denies leaking of fluid.   The following portions of the patient's history were reviewed and updated as appropriate: allergies, current medications, past family history, past medical history, past social history, past surgical history and problem list.   Objective:   Vitals:   12/13/21 1429  BP: 106/64  Pulse: 80  Weight: 183 lb (83 kg)    Fetal Status: Fetal Heart Rate (bpm): 131   Movement: Present     General:  Alert, oriented and cooperative. Patient is in no acute distress.  Skin: Skin is warm and dry. No rash noted.   Cardiovascular: Normal heart rate noted  Respiratory: Normal respiratory effort, no problems with respiration noted  Abdomen: Soft, gravid, appropriate for gestational age.  Pain/Pressure: Present     Pelvic: Cervical exam deferred        Extremities: Normal range of motion.  Edema: None  Mental Status: Normal mood and affect. Normal behavior. Normal judgment and thought content.   Assessment and Plan:  Pregnancy: G1P0 at [redacted]w[redacted]d 1. Vaginal discharge during pregnancy in third trimester Self swab today. She has a h/o recurrent yeast infections outside of pregnancy - Cervicovaginal ancillary only( [place])  2. Supervision of high risk pregnancy, antepartum Continue low dose asa  3. Pre-existing type 2 diabetes mellitus in  pregnancy in third trimester Pt currently on metformin 1000/1000 (qhs) and Levemir 10u q12h and aspart 10 tidac Pt just finished with her 2wk dexcom trial. Pt to see if her insurance covers it since she has new one now but to do CBGs in the interim. 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 Continue on current regimen for now.   Follow up 1/20 rpt growth and pt to start qwk BPPs at that time. I d/w her that she'll likely be a 38-39wk  IOL given DM2 on insulin status but can pin that down more in a few weeks 12/20: 66%, 1398gm, ac 82%, afi 16 11/28 fetal echo wnl  Preterm labor symptoms and general obstetric precautions including but not limited to vaginal bleeding, contractions, leaking of fluid and fetal movement were reviewed in detail with the patient. Please refer to After Visit Summary for other counseling recommendations.   Return in about 10 days (around 12/23/2021) for in person, md visit, high risk ob.  Future Appointments  Date Time Provider [place]  12/23/2021  2:30 PM [facility] NURSE [facility]  12/23/2021  2:45 PM [facility] US5 [facility]  12/26/2021  2:15 PM [facility] NST [facility]  01/04/2022 10:15 AM [facility] NST [facility]  01/11/2022 11:15 AM [facility] NST [facility]    Aletha Halim, MD

## 2021-12-14 LAB — CERVICOVAGINAL ANCILLARY ONLY
Bacterial Vaginitis (gardnerella): NEGATIVE
Candida Glabrata: NEGATIVE
Candida Vaginitis: POSITIVE — AB
Chlamydia: NEGATIVE
Comment: NEGATIVE
Comment: NEGATIVE
Comment: NEGATIVE
Comment: NEGATIVE
Comment: NEGATIVE
Comment: NORMAL
Neisseria Gonorrhea: NEGATIVE
Trichomonas: NEGATIVE

## 2021-12-19 ENCOUNTER — Encounter: Payer: Self-pay | Admitting: Obstetrics and Gynecology

## 2021-12-23 ENCOUNTER — Other Ambulatory Visit: Payer: Self-pay

## 2021-12-23 ENCOUNTER — Ambulatory Visit (HOSPITAL_BASED_OUTPATIENT_CLINIC_OR_DEPARTMENT_OTHER): Payer: 59

## 2021-12-23 ENCOUNTER — Other Ambulatory Visit: Payer: Self-pay | Admitting: Obstetrics and Gynecology

## 2021-12-23 ENCOUNTER — Ambulatory Visit (INDEPENDENT_AMBULATORY_CARE_PROVIDER_SITE_OTHER): Payer: 59 | Admitting: Obstetrics & Gynecology

## 2021-12-23 ENCOUNTER — Inpatient Hospital Stay (HOSPITAL_COMMUNITY)
Admission: AD | Admit: 2021-12-23 | Discharge: 2021-12-23 | Disposition: A | Discharge status: Home / Self Care | Payer: 59 | Attending: Obstetrics & Gynecology | Admitting: Obstetrics & Gynecology

## 2021-12-23 ENCOUNTER — Inpatient Hospital Stay (HOSPITAL_BASED_OUTPATIENT_CLINIC_OR_DEPARTMENT_OTHER): Payer: 59

## 2021-12-23 ENCOUNTER — Ambulatory Visit: Payer: 59 | Admitting: *Deleted

## 2021-12-23 ENCOUNTER — Encounter (HOSPITAL_COMMUNITY): Payer: Self-pay | Admitting: Obstetrics & Gynecology

## 2021-12-23 VITALS — BP 112/64 | HR 79 | Wt 184.4 lb

## 2021-12-23 VITALS — BP 117/57 | HR 96

## 2021-12-23 DIAGNOSIS — E119 Type 2 diabetes mellitus without complications: Secondary | ICD-10-CM

## 2021-12-23 DIAGNOSIS — O0990 Supervision of high risk pregnancy, unspecified, unspecified trimester: Secondary | ICD-10-CM

## 2021-12-23 DIAGNOSIS — Z23 Encounter for immunization: Secondary | ICD-10-CM

## 2021-12-23 DIAGNOSIS — Z7984 Long term (current) use of oral hypoglycemic drugs: Secondary | ICD-10-CM | POA: Diagnosis not present

## 2021-12-23 DIAGNOSIS — Z3A33 33 weeks gestation of pregnancy: Secondary | ICD-10-CM

## 2021-12-23 DIAGNOSIS — O24113 Pre-existing diabetes mellitus, type 2, in pregnancy, third trimester: Secondary | ICD-10-CM

## 2021-12-23 DIAGNOSIS — O283 Abnormal ultrasonic finding on antenatal screening of mother: Secondary | ICD-10-CM

## 2021-12-23 DIAGNOSIS — O288 Other abnormal findings on antenatal screening of mother: Secondary | ICD-10-CM | POA: Diagnosis not present

## 2021-12-23 DIAGNOSIS — O0993 Supervision of high risk pregnancy, unspecified, third trimester: Secondary | ICD-10-CM | POA: Insufficient documentation

## 2021-12-23 DIAGNOSIS — Z794 Long term (current) use of insulin: Secondary | ICD-10-CM | POA: Diagnosis not present

## 2021-12-23 DIAGNOSIS — O368330 Maternal care for abnormalities of the fetal heart rate or rhythm, third trimester, not applicable or unspecified: Secondary | ICD-10-CM | POA: Diagnosis present

## 2021-12-23 DIAGNOSIS — Z3689 Encounter for other specified antenatal screening: Secondary | ICD-10-CM

## 2021-12-23 HISTORY — DX: Urinary tract infection, site not specified: N39.0

## 2021-12-23 HISTORY — DX: Type 2 diabetes mellitus without complications: E11.9

## 2021-12-23 LAB — GLUCOSE, CAPILLARY: Glucose-Capillary: 117 mg/dL — ABNORMAL HIGH (ref 70–99)

## 2021-12-23 MED ORDER — DEXCOM G6 SENSOR MISC: 5 refills | Status: DC

## 2021-12-23 MED ORDER — METFORMIN HCL ER 500 MG PO TB24: 1000.0000 mg | ORAL_TABLET | Freq: Two times a day (BID) | ORAL | 3 refills | Status: DC

## 2021-12-23 NOTE — MAU Provider Note (Signed)
History     CSN: [id number]  Arrival date and time: 12/23/21 1712   Event Date/Time   First Provider Initiated Contact with Patient 12/23/21 1838      Chief Complaint  Patient presents with   [facility]   Sherry Campbell is a 36 y.o. G1P0 at 8w1dwho presents to MAU for prolonged monitoring and repeat BPP. Patient with pre-existing Type II DM was seen by OB and MFM today. In MFM patient with 6/8 BPP (-2 for breathing) nad non-reactive NST. Patient was sent to MAU by MFM for additional monitoring and repeat BPP 4 hours after BPP in office today, which was conducted at approximately 315PM. Pt able to verbalize completely why she was sent to MAU for additional care today. Pt's fiance present for entire visit.  Pt denies VB, LOF, ctx, decreased FM, vaginal discharge/odor/itching. Pt denies N/V, abdominal pain, constipation, diarrhea, or urinary problems. Pt denies fever, chills, fatigue, sweating or changes in appetite. Pt denies SOB or chest pain. Pt denies dizziness, HA, light-headedness, weakness.   OB History     Gravida  1   Para      Term      Preterm      AB      Living         SAB      IAB      Ectopic      Multiple      Live Births              Past Medical History:  Diagnosis Date   Anxiety    Anxiety    Depression    Depression    Diabetes mellitus without complication ([facility]    dx April 2022 Type 2   Dysplasia of cervix, low grade (CIN 1)    Syncope    UTI (urinary tract infection)     Past Surgical History:  Procedure Laterality Date   WISDOM TOOTH EXTRACTION      Family History  Problem Relation Age of Onset   Cancer Mother        breast, stage 1 summer 2022   Kidney disease Mother        post translplant   Diabetes Mother    Hypertension Mother    Breast cancer Mother 541  Other Mother        BRCA2 mutation   Hyperthyroidism Mother    Diabetes Father    Breast cancer Paternal Aunt        dx. 626s  Diabetes Maternal  Grandmother    Diabetes Maternal Grandfather    Heart disease Maternal Grandfather    Diabetes Paternal Grandmother    Diabetes Paternal Grandfather    Bone cancer Paternal Grandfather        dx. 7100s  Ovarian cancer Maternal Great-grandmother [phone]   Social History   Tobacco Use   Smoking status: Former    Types: Cigarettes   Smokeless tobacco: Never   Tobacco comments:    Quit 2019  Vaping Use   Vaping Use: Former   Devices: 2020- briefly  Substance Use Topics   Alcohol use: Not Currently    Comment: SOCIALLY   Drug use: No    Allergies: No Known Allergies  Medications Prior to Admission  Medication Sig Dispense Refill Last Dose   aspirin 81 MG chewable tablet Chew by mouth daily.   12/22/2021   FLUoxetine (PROZAC) 40 MG capsule Take 1 capsule (40 mg total) by  mouth daily. 30 capsule 6 12/22/2021   insulin aspart (NOVOLOG FLEXPEN) 100 UNIT/ML FlexPen Inject 10 Units into the skin 3 (three) times daily before meals. 15 mL 6 12/23/2021 at 0200   LEVEMIR FLEXTOUCH 100 UNIT/ML FlexTouch Pen Inject 10 Units into the skin 2 (two) times daily. 15 mL 1 12/23/2021   metFORMIN (GLUCOPHAGE-XR) 500 MG 24 hr tablet Take 2 tablets (1,000 mg total) by mouth in the morning and at bedtime. 120 tablet 3 12/23/2021   Miconazole Nitrate Applicator (MONISTAT 7 COMBO PACK APP) 100 & 2 MG-% (9GM) KIT Place vaginally.   12/22/2021   Prenatal MV & Min w/FA-DHA (PRENATAL ADULT GUMMY/DHA/FA PO) Take by mouth.   12/22/2021   Continuous Blood Gluc Sensor (DEXCOM G6 SENSOR) MISC Use one sensor every ten days as instructed 3 each 5     Review of Systems  Constitutional:  Negative for chills, diaphoresis, fatigue and fever.  Eyes:  Negative for visual disturbance.  Respiratory:  Negative for shortness of breath.   Cardiovascular:  Negative for chest pain.  Gastrointestinal:  Negative for abdominal pain, constipation, diarrhea, nausea and vomiting.  Genitourinary:  Negative for dysuria, flank pain, frequency,  pelvic pain, urgency, vaginal bleeding and vaginal discharge.  Neurological:  Negative for dizziness, weakness, light-headedness and headaches.   Physical Exam   Blood pressure 107/60, pulse 99, temperature 97.9 F (36.6 C), temperature source Oral, resp. rate 20, last menstrual period 05/05/2021, SpO2 100 %.  Patient Vitals for the past 24 hrs:  BP Temp Temp src Pulse Resp SpO2  12/23/21 1930 107/60 -- -- 99 20 100 %  12/23/21 1731 109/60 97.9 F (36.6 C) Oral 93 20 99 %   Physical Exam Vitals and nursing note reviewed.  Constitutional:      General: She is not in acute distress.    Appearance: Normal appearance. She is not ill-appearing, toxic-appearing or diaphoretic.  HENT:     Head: Normocephalic and atraumatic.  Pulmonary:     Effort: Pulmonary effort is normal.  Neurological:     Mental Status: She is alert and oriented to person, place, and time.  Psychiatric:        Mood and Affect: Mood normal.        Behavior: Behavior normal.        Thought Content: Thought content normal.        Judgment: Judgment normal.   Results for orders placed or performed during the hospital encounter of 12/23/21 (from the past 24 hour(s))  Glucose, capillary     Status: Abnormal   Collection Time: 12/23/21  6:29 PM  Result Value Ref Range   Glucose-Capillary 117 (H) 70 - 99 mg/dL   [place] MFM FETAL BPP W/NONSTRESS  Result Date: 12/23/2021 ----------------------------------------------------------------------  OBSTETRICS REPORT                       (Signed Final 12/23/2021 04:53 pm) ---------------------------------------------------------------------- Patient Info  ID #:       [id number]                          D.O.B.:  January 24, 1986 (35 yrs)  Name:       Sherry Campbell                   Visit Date: 12/23/2021 03:50 pm ---------------------------------------------------------------------- Performed By  Attending:        Tama High MD  Ref. Address[facility]     [facility]                                                              [place]  Performed By:     Benson Norway          [facility]                    RD[facility]                                     [facility]                                                             [place] for                                                             Women  Referred By:      Gabriel Carina CNM ---------------------------------------------------------------------- Orders  #  Description                           Code        Ordered By  1  [place] MFM OB FOLLOW UP                   76816.01    RAVI SHANKAR  2  [place] MFM FETAL BPP                      57262.0     RAVI [facility]     W/NONSTRESS ----------------------------------------------------------------------  #  Order #                     Accession #                Episode #  1  [id number]                   [id number]                 [id number]  2  [id number]                   [id number]  [id number] ---------------------------------------------------------------------- Indications  Diabetes - Pregestational, 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 Cord Insertion:      Previously Visualized  Amniotic Fluid  AFI FV:      Within normal limits  AFI Sum(cm)     %Tile       Largest Pocket(cm)  12.96           40          4.28  RUQ(cm)       RLQ(cm)       LUQ(cm)        LLQ(cm)  1.85          2.71          4.28           4.12 ---------------------------------------------------------------------- Biophysical Evaluation  Amniotic F.V:   Pocket => 2 cm              F. Tone:        Observed  F. Movement:    Observed                   N.S.T:          Nonreactive  F. Breathing:   Not Observed               Score:          6/10 ---------------------------------------------------------------------- Biometry  BPD:      86.4  mm     G. Age:  34w 6d         88  %    CI:        78.61   %    70 - 86                                                          FL/HC:      21.0   %    19.9 - 21.5  HC:      308.2  mm     G. Age:  34w 3d         71  %    HC/AC:      1.03        0.96 - 1.11  AC:      298.4  mm     G. Age:  33w 6d         71  %    FL/BPD:     74.9   %    71 - 87  FL:       64.7  mm     G. Age:  33w 3d         45  %    FL/AC:      21.7   %  20 - 24  LV:        2.8  mm  Est. FW:    2290  gm      5 lb 1 oz     64  % ---------------------------------------------------------------------- OB History  Gravidity:    1         Term:   0 ---------------------------------------------------------------------- Gestational Age  LMP:           33w 1d        Date:  05/05/21                 EDD:   02/09/22  U/S Today:     34w 1d                                        EDD:   02/02/22  Best:          33w 1d     Det. By:  LMP  (05/05/21)          EDD:   02/09/22 ---------------------------------------------------------------------- Anatomy  Heart:                 Appears normal         Kidneys:                Appear normal                         (4CH, axis, and                         situs)  Diaphragm:             Appears normal         Bladder:                Appears normal  Stomach:               Appears normal, left                         sided ---------------------------------------------------------------------- Cervix Uterus Adnexa  Cervix  Normal appearance by transabdominal scan.  Right Ovary  Within normal limits.  Left Ovary  Within normal limits. ---------------------------------------------------------------------- Impression  Pregestational diabetes.  Patient takes insulin and  metformin.  She had a prenatal visit today.  Patient takes Levemir 10  units in the morning and 10 units at night and 10 to 15 units  of NovoLog with each meal.  She reports her diabetes is not  well controlled.  Blood pressure today at her office is 117/57 mmHg.  Fetal growth is appropriate for gestational age.  Amniotic fluid  is normal good fetal activity seen.  Fetal breathing  movements did not meet the criteria of BPP.  NST is  nonreactive.  BPP 6/10.  I recommended that she go to the MAU for prolonged  monitoring and repeat BPP in 3 to 4 hours.  Discussed with MAU team.  Patient will be going over to the  MAU. ---------------------------------------------------------------------- Recommendations  - Prolonged NST monitoring.  -Repeat BPP in 3 to 4 hours.  -Patient has appointments for weekly BPP.  -An appointment was made for her to return in 4 weeks for  fetal growth assessment and BPP. ----------------------------------------------------------------------  Tama High, MD Electronically Signed Final Report   12/23/2021 04:53 pm ----------------------------------------------------------------------  [place] MFM OB FOLLOW UP  Result Date: 12/23/2021 ----------------------------------------------------------------------  OBSTETRICS REPORT                       (Signed Final 12/23/2021 04:53 pm) ---------------------------------------------------------------------- Patient Info  ID #:       [id number]                          D.O.B.:  Mar 04, 1986 (35 yrs)  Name:       Sherry Campbell                   Visit Date: 12/23/2021 03:50 pm ---------------------------------------------------------------------- Performed By  Attending:        Tama High MD        Ref. Address:     [facility]                                                             [place]  Performed By:     Benson Norway          Location[facility]         [facility]                     RD[facility]                                     [facility] at                                                             [place] for                                                             Women  Referred By:      Gabriel Carina CNM ---------------------------------------------------------------------- Orders  #  Description                           Code        Ordered By  1  [place] MFM OB FOLLOW UP                   76816.01    RAVI [facility]  2  [place] MFM FETAL BPP                      [id number]     RAVI [facility]     W/NONSTRESS ----------------------------------------------------------------------  #  Order #                     Accession #                Episode #  1  [id number]                   [id number]                 [id number]  2  [id number]                   [id number]                 [id number] ---------------------------------------------------------------------- Indications  Diabetes - Pregestational, 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 Cord Insertion:      Previously Visualized  Amniotic Fluid  AFI FV:      Within normal limits  AFI Sum(cm)     %Tile       Largest Pocket(cm)  12.96           40          4.28  RUQ(cm)       RLQ(cm)       LUQ(cm)        LLQ(cm)  1.85          2.71          4.28           4.12 ---------------------------------------------------------------------- Biophysical Evaluation  Amniotic F.V:   Pocket => 2 cm             F. Tone:        Observed  F. Movement:    Observed                   N.S.T:          Nonreactive  F. Breathing:   Not Observed               Score:          6/10  ---------------------------------------------------------------------- Biometry  BPD:      86.4  mm     G. Age:  34w 6d         88  %    CI:        78.61   %    70 - 86  FL/HC:      21.0   %    19.9 - 21.5  HC:      308.2  mm     G. Age:  34w 3d         45  %    HC/AC:      1.03        0.96 - 1.11  AC:      298.4  mm     G. Age:  33w 6d         71  %    FL/BPD:     74.9   %    71 - 87  FL:       64.7  mm     G. Age:  33w 3d         25  %    FL/AC:      21.7   %    20 - 24  LV:        2.8  mm  Est. FW:    2290  gm      5 lb 1 oz     64  % ---------------------------------------------------------------------- OB History  Gravidity:    1         Term:   0 ---------------------------------------------------------------------- Gestational Age  LMP:           33w 1d        Date:  05/05/21                 EDD:   02/09/22  U/S Today:     34w 1d                                        EDD:   02/02/22  Best:          33w 1d     Det. By:  LMP  (05/05/21)          EDD:   02/09/22 ---------------------------------------------------------------------- Anatomy  Heart:                 Appears normal         Kidneys:                Appear normal                         (4CH, axis, and                         situs)  Diaphragm:             Appears normal         Bladder:                Appears normal  Stomach:               Appears normal, left                         sided ---------------------------------------------------------------------- Cervix Uterus Adnexa  Cervix  Normal appearance by transabdominal scan.  Right Ovary  Within normal limits.  Left Ovary  Within normal limits. ---------------------------------------------------------------------- Impression  Pregestational diabetes.  Patient takes insulin and metformin.  She had a prenatal visit today.  Patient takes Levemir 10  units in the morning and 10 units  at night and 10 to 15 units  of NovoLog with each meal.  She  reports her diabetes is not  well controlled.  Blood pressure today at her office is 117/57 mmHg.  Fetal growth is appropriate for gestational age.  Amniotic fluid  is normal good fetal activity seen.  Fetal breathing  movements did not meet the criteria of BPP.  NST is  nonreactive.  BPP 6/10.  I recommended that she go to the MAU for prolonged  monitoring and repeat BPP in 3 to 4 hours.  Discussed with MAU team.  Patient will be going over to the  MAU. ---------------------------------------------------------------------- Recommendations  - Prolonged NST monitoring.  -Repeat BPP in 3 to 4 hours.  -Patient has appointments for weekly BPP.  -An appointment was made for her to return in 4 weeks for  fetal growth assessment and BPP. ----------------------------------------------------------------------                  Tama High, MD Electronically Signed Final Report   12/23/2021 04:53 pm ----------------------------------------------------------------------   MAU Course  Procedures  MDM -pt to MAU for prolonged monitoring and repeat BPP after 6/10 BPP + NST at MFM today -repeat BPP in MAU 8/8 -consulted with Dr. Elonda Husky who states if patient has not had any decelerations while on the monitor that she can be discharged to home EFM: reactive       -baseline: 130       -variability: moderate       -accels: present, 15x15       -decels: absent       -TOCO: minor irritability -pt discharged to home in stable condition  Orders Placed This Encounter  Procedures   [place] MFM FETAL BPP WO NON STRESS    Standing Status:   Standing    Number of Occurrences:   1    Order Specific Question:   Symptom/Reason for Exam    Answer:   Non-reactive NST (non-stress test) [998338]   Glucose, capillary    Standing Status:   Standing    Number of Occurrences:   1   Discharge patient    Order Specific Question:   Discharge disposition    Answer:   01-Home or Self Care [1]    Order Specific Question:   Discharge  patient date    Answer:   12/23/2021   No orders of the defined types were placed in this encounter.  Assessment and Plan   1. NST (non-stress test) reactive   2. Supervision of high risk pregnancy, antepartum   3. Non-reactive NST (non-stress test)   4. [redacted] weeks gestation of pregnancy   5. Pre-existing type 2 diabetes mellitus in pregnancy in third trimester    Allergies as of 12/23/2021   No Known Allergies      Medication List     TAKE these medications    aspirin 81 MG chewable tablet Chew by mouth daily.   Dexcom G6 Sensor Misc Use one sensor every ten days as instructed   FLUoxetine 40 MG capsule Commonly known as: PROzac Take 1 capsule (40 mg total) by mouth daily.   Levemir FlexTouch 100 UNIT/ML FlexPen Generic drug: insulin detemir Inject 10 Units into the skin 2 (two) times daily.   metFORMIN 500 MG 24 hr tablet Commonly known as: GLUCOPHAGE-XR Take 2 tablets (1,000 mg total) by mouth in the morning and at bedtime.   Monistat 7 Combo Pack App 100 & 2 MG-% (9GM) Kit Generic drug: Miconazole  Nitrate Applicator Place vaginally.   NovoLOG FlexPen 100 UNIT/ML FlexPen Generic drug: insulin aspart Inject 10 Units into the skin 3 (three) times daily before meals.   PRENATAL ADULT GUMMY/DHA/FA PO Take by mouth.       -pt advised to f/u with office on Monday regarding possible non-functioning glucose monitor as her monitor was reading 60 in MAU while our CBG was 117 at the same time -return MAU precautions given -pt discharged to home in stable  [place] E Prestyn Mahn 12/23/2021, 7:54 PM

## 2021-12-23 NOTE — Patient Instructions (Signed)
Return to office for any scheduled appointments. Call the office or go to the MAU at [facility] if:  You begin to have strong, frequent contractions  Your water breaks.  Sometimes it is a big gush of fluid, sometimes it is just a trickle that keeps getting your panties wet or running down your legs  You have vaginal bleeding.  It is normal to have a small amount of spotting if your cervix was checked.   You do not feel your baby moving like normal.  If you do not, get something to eat and drink and lay down and focus on feeling your baby move.   If your baby is still not moving like normal, you should call the office or go to MAU.  Any other obstetric concerns.   

## 2021-12-23 NOTE — Procedures (Signed)
Sherry Campbell 12-11-1985 [redacted]w[redacted]d  Fetus A Non-Stress Test Interpretation for 12/23/21  Indication: Unsatisfactory BPP  Fetal Heart Rate A Mode: External Baseline Rate (A): 130 bpm Variability: Minimal, Moderate Accelerations: None Decelerations: None Multiple birth?: No  Uterine Activity Mode: Palpation, Toco Contraction Frequency (min): none Resting Tone Palpated: Relaxed  Interpretation (Fetal Testing) Nonstress Test Interpretation: Non-reactive Overall Impression: Reassuring for gestational age Comments: Dr. Judeth Cornfield reviewed tracing. Patient sent to MAU [facility] for prolonged monitoring.

## 2021-12-23 NOTE — MAU Note (Signed)
Sent from MFM office for [facility] 6/8.  Denies LOF or VB.  Reports +FM.

## 2021-12-23 NOTE — Progress Notes (Signed)
° °  PRENATAL VISIT NOTE  Subjective:  Sherry Campbell is a 36 y.o. G1P0 at 18w1dbeing seen today for ongoing prenatal care.  She is currently monitored for the following issues for this high-risk pregnancy and has Anxiety; Depression; Family history of BRCA2 gene positive; Family history of breast cancer; Type 2 diabetes mellitus without complication, without long[place]term [place] use [place]; Supervision of high risk pregnancy, antepartum; and Pre-existing type 2 diabetes mellitus in pregnancy in third trimester on their problem list.  Patient reports no complaints.  Contractions: Irritability. Vag. Bleeding: None.  Movement: Present. Denies leaking of fluid.   The following portions of the patient's history were reviewed and updated as appropriate: allergies, current medications, past family history, past medical history, past social history, past surgical history and problem list.   Objective:   Vitals:   12/23/21 1047  BP: 112/64  Pulse: 79  Weight: 184 lb 6.4 oz (83.6 kg)    Fetal Status: Fetal Heart Rate (bpm): 155   Movement: Present     General:  Alert, oriented and cooperative. Patient is in no acute distress.  Skin: Skin is warm and dry. No rash noted.   Cardiovascular: Normal heart rate noted  Respiratory: Normal respiratory effort, no problems with respiration noted  Abdomen: Soft, gravid, appropriate for gestational age.  Pain/Pressure: Present     Pelvic: Cervical exam deferred        Extremities: Normal range of motion.  Edema: None  Mental Status: Normal mood and affect. Normal behavior. Normal judgment and thought content.   Assessment and Plan:  Pregnancy: G1P0 at 352w1d. Pre-existing type 2 diabetes mellitus in pregnancy in third trimester Uses Dexcom, AnLevie Heritageo help review this data.  A few values recorded on meter, mostly within limit.  Continue current insulin and Metformin regimen. Continue scans and antenatal testing as scheduled by MFM.  -  Continuous Blood Gluc Sensor (DEXCOM G6 SENSOR) MISC; Use one sensor every ten days as instructed  Dispense: 3 each; Refill: 5 - metFORMIN (GLUCOPHAGE-XR) 500 MG 24 hr tablet; Take 2 tablets (1,000 mg total) by mouth in the morning and at bedtime.  Dispense: 120 tablet; Refill: 3 - Hemoglobin A1c - Comprehensive metabolic panel  2. [redacted] weeks gestation of pregnancy 3. Supervision of high risk pregnancy, antepartum Third trimester labs checked, will follow up results and manage accordingly. Tdap vaccine given.  - CBC - RPR - HIV Antibody (routine testing w rflx) - Tdap vaccine greater than or equal to 7yo IM Preterm labor symptoms and general obstetric precautions including but not limited to vaginal bleeding, contractions, leaking of fluid and fetal movement were reviewed in detail with the patient. Please refer to After Visit Summary for other counseling recommendations.   Return in about 2 weeks (around 01/06/2022) for OFFICE OB VISIT (MD only).  Future Appointments  Date Time Provider DeCrisman1/20/2023  2:30 PM [facility]/20/2023  2:45 PM W[facility]-MFC US5 [facility]US [facility]1/23/2023  2:15 PM [facility]WOCA NST [facility]2/12/2021  8:55 AM ArWoodroe ModeMD [facility]2/12/2021 10:15 AM [facility]WOCA NST [facility]2/07/2022 11:15 AM WMC-WOCA NST WMC-CWH WMMinden  UgVerita SchneidersMD

## 2021-12-23 NOTE — Progress Notes (Signed)
Pt requesting blood glucose be checked, states Dexcom glucose monitoring sytem alerting that glucose low.  RN will check CBG.

## 2021-12-24 LAB — COMPREHENSIVE METABOLIC PANEL
ALT: 12 IU/L (ref 0–32)
AST: 16 IU/L (ref 0–40)
Albumin/Globulin Ratio: 1.3 (ref 1.2–2.2)
Albumin: 3.4 g/dL — ABNORMAL LOW (ref 3.8–4.8)
Alkaline Phosphatase: 94 IU/L (ref 44–121)
BUN/Creatinine Ratio: 13 (ref 9–23)
BUN: 8 mg/dL (ref 6–20)
Bilirubin Total: 0.3 mg/dL (ref 0.0–1.2)
CO2: 23 mmol/L (ref 20–29)
Calcium: 8.8 mg/dL (ref 8.7–10.2)
Chloride: 100 mmol/L (ref 96–106)
Creatinine, Ser: 0.64 mg/dL (ref 0.57–1.00)
Globulin, Total: 2.7 g/dL (ref 1.5–4.5)
Glucose: 82 mg/dL (ref 70–99)
Potassium: 3.7 mmol/L (ref 3.5–5.2)
Sodium: 136 mmol/L (ref 134–144)
Total Protein: 6.1 g/dL (ref 6.0–8.5)
eGFR: 120 mL/min/{1.73_m2} (ref >59)

## 2021-12-24 LAB — CBC
Hematocrit: 33.4 % — ABNORMAL LOW (ref 34.0–46.6)
Hemoglobin: 11.3 g/dL (ref 11.1–15.9)
MCH: 29.8 pg (ref 26.6–33.0)
MCHC: 33.8 g/dL (ref 31.5–35.7)
MCV: 88 fL (ref 79–97)
Platelets: 236 10*3/uL (ref 150–450)
RBC: 3.79 x10E6/uL (ref 3.77–5.28)
RDW: 12.6 % (ref 11.7–15.4)
WBC: 8.3 10*3/uL (ref 3.4–10.8)

## 2021-12-24 LAB — HEMOGLOBIN A1C
Est. average glucose Bld gHb Est-mCnc: 126 mg/dL
Hgb A1c MFr Bld: 6 % — ABNORMAL HIGH (ref 4.8–5.6)

## 2021-12-24 LAB — RPR: RPR Ser Ql: NONREACTIVE

## 2021-12-24 LAB — HIV ANTIBODY (ROUTINE TESTING W REFLEX): HIV Screen 4th Generation wRfx: NONREACTIVE

## 2021-12-26 ENCOUNTER — Other Ambulatory Visit: Payer: Self-pay

## 2021-12-26 ENCOUNTER — Other Ambulatory Visit: Payer: Self-pay | Admitting: *Deleted

## 2021-12-26 ENCOUNTER — Ambulatory Visit (INDEPENDENT_AMBULATORY_CARE_PROVIDER_SITE_OTHER): Payer: 59

## 2021-12-26 ENCOUNTER — Ambulatory Visit (INDEPENDENT_AMBULATORY_CARE_PROVIDER_SITE_OTHER): Payer: 59 | Admitting: General Practice

## 2021-12-26 VITALS — BP 126/67 | HR 101

## 2021-12-26 DIAGNOSIS — O24113 Pre-existing diabetes mellitus, type 2, in pregnancy, third trimester: Secondary | ICD-10-CM

## 2021-12-26 DIAGNOSIS — O0990 Supervision of high risk pregnancy, unspecified, unspecified trimester: Secondary | ICD-10-CM

## 2021-12-26 NOTE — Progress Notes (Signed)
Pt informed that the ultrasound is considered a limited OB ultrasound and is not intended to be a complete ultrasound exam.  Patient also informed that the ultrasound is not being completed with the intent of assessing for fetal or placental anomalies or any pelvic abnormalities.  Explained that the purpose of today’s ultrasound is to assess for  BPP, presentation, and AFI.  Patient acknowledges the purpose of the exam and the limitations of the study.    ° °Preet Mangano H RN BSN °12/26/21 ° °

## 2021-12-28 ENCOUNTER — Other Ambulatory Visit: Payer: 59

## 2022-01-02 ENCOUNTER — Encounter: Payer: Self-pay | Admitting: Obstetrics & Gynecology

## 2022-01-04 ENCOUNTER — Ambulatory Visit (INDEPENDENT_AMBULATORY_CARE_PROVIDER_SITE_OTHER): Payer: 59

## 2022-01-04 ENCOUNTER — Other Ambulatory Visit: Payer: Self-pay

## 2022-01-04 ENCOUNTER — Ambulatory Visit (INDEPENDENT_AMBULATORY_CARE_PROVIDER_SITE_OTHER): Payer: 59 | Admitting: Obstetrics & Gynecology

## 2022-01-04 ENCOUNTER — Ambulatory Visit (INDEPENDENT_AMBULATORY_CARE_PROVIDER_SITE_OTHER): Payer: 59 | Admitting: General Practice

## 2022-01-04 VITALS — BP 114/64 | HR 87 | Wt 185.0 lb

## 2022-01-04 DIAGNOSIS — O24113 Pre-existing diabetes mellitus, type 2, in pregnancy, third trimester: Secondary | ICD-10-CM

## 2022-01-04 DIAGNOSIS — O0990 Supervision of high risk pregnancy, unspecified, unspecified trimester: Secondary | ICD-10-CM

## 2022-01-04 MED ORDER — "INSULIN SYRINGE 31G X 5/16"" 1 ML MISC": 1.0000 | Freq: Three times a day (TID) | 1 refills | Status: DC

## 2022-01-04 MED ORDER — INSULIN ASPART 100 UNIT/ML IJ SOLN: 50.0000 [IU] | Freq: Three times a day (TID) | INTRAMUSCULAR | 3 refills | Status: DC

## 2022-01-04 NOTE — Progress Notes (Signed)
Pt informed that the ultrasound is considered a limited OB ultrasound and is not intended to be a complete ultrasound exam.  Patient also informed that the ultrasound is not being completed with the intent of assessing for fetal or placental anomalies or any pelvic abnormalities.  Explained that the purpose of today’s ultrasound is to assess for  BPP, presentation, and AFI.  Patient acknowledges the purpose of the exam and the limitations of the study.    ° °Janith Nielson H RN BSN °01/04/22 ° °

## 2022-01-04 NOTE — Progress Notes (Signed)
° °  PRENATAL VISIT NOTE  Subjective:  Sherry Campbell is a 36 y.o. G1P0 at 64w6dbeing seen today for ongoing prenatal care.  She is currently monitored for the following issues for this high-risk pregnancy and has Anxiety; Depression; Family history of BRCA2 gene positive; Family history of breast cancer; Type 2 diabetes mellitus without complication, without long-term current use of insulin ([facility]; Supervision of high risk pregnancy, antepartum; and Pre-existing type 2 diabetes mellitus in pregnancy in third trimester on their problem list.  Patient reports  problem with insurance coverage for insulin, high deductible  .  Contractions: Not present. Vag. Bleeding: None.  Movement: Present. Denies leaking of fluid.   The following portions of the patient's history were reviewed and updated as appropriate: allergies, current medications, past family history, past medical history, past social history, past surgical history and problem list.   Objective:   Vitals:   01/04/22 0906  BP: 114/64  Pulse: 87  Weight: 185 lb (83.9 kg)    Fetal Status: Fetal Heart Rate (bpm): 128   Movement: Present     General:  Alert, oriented and cooperative. Patient is in no acute distress.  Skin: Skin is warm and dry. No rash noted.   Cardiovascular: Normal heart rate noted  Respiratory: Normal respiratory effort, no problems with respiration noted  Abdomen: Soft, gravid, appropriate for gestational age.  Pain/Pressure: Absent     Pelvic: Cervical exam deferred        Extremities: Normal range of motion.     Mental Status: Normal mood and affect. Normal behavior. Normal judgment and thought content.   Assessment and Plan:  Pregnancy: G1P0 at 362w6d. Pre-existing type 2 diabetes mellitus in pregnancy in third trimester FBS <105, PP up to 230 and she is adjusting Humalog   2. Supervision of high risk pregnancy, antepartum F/u BPP  Preterm labor symptoms and general obstetric precautions including but not  limited to vaginal bleeding, contractions, leaking of fluid and fetal movement were reviewed in detail with the patient. Please refer to After Visit Summary for other counseling recommendations.   Return in about 1 week (around 01/11/2022).  Future Appointments  Date Time Provider [place]12[facility]2021 10:[facility]2/07/2022 11:15 AM [facility] NST [facility]2/16/2023  9:30 AM WMC-MFC NURSE [facility]2/16/2023  9:45 AM WMC-MFC US4 WMC-MFCUS WMCall[place]  JaEmeterio ReeveMD

## 2022-01-10 ENCOUNTER — Telehealth: Payer: Self-pay | Admitting: Lactation Services

## 2022-01-10 NOTE — Telephone Encounter (Signed)
Have attempted to send PA for Dexcom G6. There is a discrepency in patients insurance listed in chart and insurance card that was scanned. Reached out to patient for confirmation of current insurance information to be able to complete PA. Patient did not answer. LM for her to call the office at her convenience. My Chart message sent.

## 2022-01-11 ENCOUNTER — Other Ambulatory Visit: Payer: Self-pay | Admitting: Advanced Practice Midwife

## 2022-01-11 ENCOUNTER — Encounter: Payer: Self-pay | Admitting: Family Medicine

## 2022-01-11 ENCOUNTER — Ambulatory Visit (INDEPENDENT_AMBULATORY_CARE_PROVIDER_SITE_OTHER): Payer: 59

## 2022-01-11 ENCOUNTER — Other Ambulatory Visit: Payer: Self-pay

## 2022-01-11 ENCOUNTER — Ambulatory Visit (INDEPENDENT_AMBULATORY_CARE_PROVIDER_SITE_OTHER): Payer: 59 | Admitting: Family Medicine

## 2022-01-11 ENCOUNTER — Other Ambulatory Visit (HOSPITAL_COMMUNITY)
Admission: RE | Admit: 2022-01-11 | Discharge: 2022-01-11 | Disposition: A | Discharge status: Home / Self Care | Payer: 59 | Source: Ambulatory Visit | Attending: Family Medicine | Admitting: Family Medicine

## 2022-01-11 ENCOUNTER — Ambulatory Visit (INDEPENDENT_AMBULATORY_CARE_PROVIDER_SITE_OTHER): Payer: 59 | Admitting: General Practice

## 2022-01-11 VITALS — BP 116/63 | HR 94

## 2022-01-11 VITALS — BP 117/61 | HR 92 | Wt 189.0 lb

## 2022-01-11 DIAGNOSIS — O0990 Supervision of high risk pregnancy, unspecified, unspecified trimester: Secondary | ICD-10-CM | POA: Diagnosis present

## 2022-01-11 DIAGNOSIS — F32A Depression, unspecified: Secondary | ICD-10-CM

## 2022-01-11 DIAGNOSIS — O24113 Pre-existing diabetes mellitus, type 2, in pregnancy, third trimester: Secondary | ICD-10-CM

## 2022-01-11 NOTE — Patient Instructions (Signed)

## 2022-01-11 NOTE — Progress Notes (Signed)
Pt informed that the ultrasound is considered a limited OB ultrasound and is not intended to be a complete ultrasound exam.  Patient also informed that the ultrasound is not being completed with the intent of assessing for fetal or placental anomalies or any pelvic abnormalities.  Explained that the purpose of today’s ultrasound is to assess for  BPP, presentation, and AFI.  Patient acknowledges the purpose of the exam and the limitations of the study.    ° °Cesiah Westley H RN BSN °01/11/22 ° °

## 2022-01-11 NOTE — Progress Notes (Signed)
° °  Subjective:  Sherry Campbell is a 36 y.o. G1P0 at [redacted]w[redacted]d being seen today for ongoing prenatal care.  She is currently monitored for the following issues for this high-risk pregnancy and has Anxiety; Depression; Family history of BRCA2 gene positive; Family history of breast cancer; Type 2 diabetes mellitus without complication, without long-term current use of insulin ([place]); Supervision of high risk pregnancy, antepartum; and Pre-existing type 2 diabetes mellitus in pregnancy in third trimester on their problem list.  Patient reports no complaints.  Contractions: Irritability. Vag. Bleeding: None.  Movement: Present. Denies leaking of fluid.   The following portions of the patient's history were reviewed and updated as appropriate: allergies, current medications, past family history, past medical history, past social history, past surgical history and problem list. Problem list updated.  Objective:   Vitals:   01/11/22 1633  BP: 117/61  Pulse: 92  Weight: 189 lb (85.7 kg)    Fetal Status: Fetal Heart Rate (bpm): 131   Movement: Present     General:  Alert, oriented and cooperative. Patient is in no acute distress.  Skin: Skin is warm and dry. No rash noted.   Cardiovascular: Normal heart rate noted  Respiratory: Normal respiratory effort, no problems with respiration noted  Abdomen: Soft, gravid, appropriate for gestational age. Pain/Pressure: Present     Pelvic: Vag. Bleeding: None     Cervical exam deferred        Extremities: Normal range of motion.     Mental Status: Normal mood and affect. Normal behavior. Normal judgment and thought content.   Urinalysis:        Assessment and Plan:  Pregnancy: G1P0 at [redacted]w[redacted]d  1. Supervision of high risk pregnancy, antepartum BP and FHR normal BPP done earlier today Swabs today given plan for delivery next week  2. Pre-existing type 2 diabetes mellitus in pregnancy in third trimester See log above Despite patient's best efforts her  sugars remain very difficult to control Given largely uncontrolled sugars and high excursions, recommended delivery at 37 weeks to avoid significant complications including stillbirth, macrosomia, post partum complications, etc. Patient very understanding and in agreement with this plan Form faxed, orders placed  Preterm labor symptoms and general obstetric precautions including but not limited to vaginal bleeding, contractions, leaking of fluid and fetal movement were reviewed in detail with the patient. Please refer to After Visit Summary for other counseling recommendations.  Return in 1 week (on 01/18/2022) for [facility], ob visit, needs MD.   Clarnce Flock, MD

## 2022-01-12 ENCOUNTER — Encounter (HOSPITAL_COMMUNITY): Payer: Self-pay | Admitting: *Deleted

## 2022-01-12 ENCOUNTER — Encounter: Payer: Self-pay | Admitting: Lactation Services

## 2022-01-12 ENCOUNTER — Encounter (HOSPITAL_COMMUNITY): Payer: Self-pay

## 2022-01-12 ENCOUNTER — Telehealth (HOSPITAL_COMMUNITY): Payer: Self-pay | Admitting: *Deleted

## 2022-01-12 LAB — CERVICOVAGINAL ANCILLARY ONLY
Chlamydia: NEGATIVE
Comment: NEGATIVE
Comment: NEGATIVE
Comment: NORMAL
Neisseria Gonorrhea: NEGATIVE
Trichomonas: NEGATIVE

## 2022-01-12 NOTE — Telephone Encounter (Signed)
Preadmission screen  

## 2022-01-15 LAB — CULTURE, BETA STREP (GROUP B ONLY): Strep Gp B Culture: NEGATIVE

## 2022-01-16 ENCOUNTER — Other Ambulatory Visit: Payer: Self-pay | Admitting: Family Medicine

## 2022-01-17 ENCOUNTER — Other Ambulatory Visit: Payer: Self-pay | Admitting: Obstetrics & Gynecology

## 2022-01-17 DIAGNOSIS — O24113 Pre-existing diabetes mellitus, type 2, in pregnancy, third trimester: Secondary | ICD-10-CM

## 2022-01-17 LAB — SARS CORONAVIRUS 2 (TAT 6-24 HRS): SARS Coronavirus 2: NEGATIVE

## 2022-01-18 ENCOUNTER — Encounter: Payer: Self-pay | Admitting: Family Medicine

## 2022-01-18 ENCOUNTER — Ambulatory Visit (INDEPENDENT_AMBULATORY_CARE_PROVIDER_SITE_OTHER): Payer: 59 | Admitting: Family Medicine

## 2022-01-18 ENCOUNTER — Other Ambulatory Visit: Payer: Self-pay

## 2022-01-18 VITALS — BP 106/75 | HR 99 | Wt 191.2 lb

## 2022-01-18 DIAGNOSIS — F32A Depression, unspecified: Secondary | ICD-10-CM

## 2022-01-18 DIAGNOSIS — O0990 Supervision of high risk pregnancy, unspecified, unspecified trimester: Secondary | ICD-10-CM

## 2022-01-18 DIAGNOSIS — E119 Type 2 diabetes mellitus without complications: Secondary | ICD-10-CM

## 2022-01-18 DIAGNOSIS — O24113 Pre-existing diabetes mellitus, type 2, in pregnancy, third trimester: Secondary | ICD-10-CM

## 2022-01-18 DIAGNOSIS — Z8481 Family history of carrier of genetic disease: Secondary | ICD-10-CM

## 2022-01-18 NOTE — Progress Notes (Signed)
° °  PRENATAL VISIT NOTE  Subjective:  Sherry Campbell is a 36 y.o. G1P0 at [redacted]w[redacted]d being seen today for ongoing prenatal care.  She is currently monitored for the following issues for this high-risk pregnancy and has Anxiety; Depression; Family history of BRCA2 gene positive; Family history of breast cancer; Type 2 diabetes mellitus without complication, without long-term current use of insulin (Sherry Campbell); Supervision of high risk pregnancy, antepartum; and Pre-existing type 2 diabetes mellitus in pregnancy in third trimester on their problem list.  Patient reports no complaints.  Contractions: Irritability. Vag. Bleeding: None.  Movement: Present. Denies leaking of fluid.   The following portions of the patient's history were reviewed and updated as appropriate: allergies, current medications, past family history, past medical history, past social history, past surgical history and problem list.   Objective:   Vitals:   01/18/22 0828  BP: 106/75  Pulse: 99  Weight: 191 lb 3.2 oz (86.7 kg)    Fetal Status: Fetal Heart Rate (bpm): 133   Movement: Present     General:  Alert, oriented and cooperative. Patient is in no acute distress.  Skin: Skin is warm and dry. No rash noted.   Cardiovascular: Normal heart rate noted  Respiratory: Normal respiratory effort, no problems with respiration noted  Abdomen: Soft, gravid, appropriate for gestational age.  Pain/Pressure: Present     Pelvic: Cervical exam deferred        Extremities: Normal range of motion.     Mental Status: Normal mood and affect. Normal behavior. Normal judgment and thought content.   Assessment and Plan:  Pregnancy: G1P0 at [redacted]w[redacted]d 1. Pre-existing type 2 diabetes mellitus in pregnancy in third trimester Fasting 88-115 (typically low 100s), After eating  71- 166 (vary ) Has IOL tomorrow Counseled to take her Levemir the day of IOL and discussed they will check BG  2. Supervision of high risk pregnancy, antepartum Up to date  3.  Family history of BRCA2 gene positive   4. Type 2 diabetes mellitus without complication, without long-term current use of insulin ([facility]) Continue to monitor  5. Depression, unspecified depression type Continue prozac Encouraged Sherry Campbell appt in 3 weeks  Preterm labor symptoms and general obstetric precautions including but not limited to vaginal bleeding, contractions, leaking of fluid and fetal movement were reviewed in detail with the patient. Please refer to After Visit Summary for other counseling recommendations.   No follow-ups on file.  Future Appointments  Date Time Provider [place]  01/19/2022  6:45 AM MC-LD [place] MC-INDC None  01/19/2022  9:30 AM WMC-MFC NURSE [facility]  01/19/2022  9:45 AM WMC-MFC US4 WMC-MFCUS [place]    Caren Macadam, MD

## 2022-01-19 ENCOUNTER — Inpatient Hospital Stay (HOSPITAL_COMMUNITY): Payer: 59

## 2022-01-19 ENCOUNTER — Ambulatory Visit: Payer: 59

## 2022-01-19 ENCOUNTER — Encounter (HOSPITAL_COMMUNITY): Payer: Self-pay | Admitting: Obstetrics and Gynecology

## 2022-01-19 ENCOUNTER — Inpatient Hospital Stay (HOSPITAL_COMMUNITY): Admission: AD | Admit: 2022-01-19 | Payer: 59 | Source: Home / Self Care | Admitting: Family Medicine

## 2022-01-19 ENCOUNTER — Inpatient Hospital Stay (HOSPITAL_COMMUNITY)
Admission: AD | Admit: 2022-01-19 | Discharge: 2022-01-21 | DRG: 807 | Disposition: A | Discharge status: Home / Self Care | Payer: 59 | Attending: Obstetrics and Gynecology | Admitting: Obstetrics and Gynecology

## 2022-01-19 DIAGNOSIS — O99344 Other mental disorders complicating childbirth: Secondary | ICD-10-CM | POA: Diagnosis present

## 2022-01-19 DIAGNOSIS — O2412 Pre-existing diabetes mellitus, type 2, in childbirth: Secondary | ICD-10-CM | POA: Diagnosis present

## 2022-01-19 DIAGNOSIS — F329 Major depressive disorder, single episode, unspecified: Secondary | ICD-10-CM | POA: Diagnosis present

## 2022-01-19 DIAGNOSIS — F419 Anxiety disorder, unspecified: Secondary | ICD-10-CM | POA: Diagnosis present

## 2022-01-19 DIAGNOSIS — Z7984 Long term (current) use of oral hypoglycemic drugs: Secondary | ICD-10-CM | POA: Diagnosis not present

## 2022-01-19 DIAGNOSIS — O0990 Supervision of high risk pregnancy, unspecified, unspecified trimester: Secondary | ICD-10-CM

## 2022-01-19 DIAGNOSIS — Z794 Long term (current) use of insulin: Secondary | ICD-10-CM

## 2022-01-19 DIAGNOSIS — Z30430 Encounter for insertion of intrauterine contraceptive device: Secondary | ICD-10-CM

## 2022-01-19 DIAGNOSIS — Z3A37 37 weeks gestation of pregnancy: Secondary | ICD-10-CM

## 2022-01-19 DIAGNOSIS — F32A Depression, unspecified: Secondary | ICD-10-CM | POA: Diagnosis present

## 2022-01-19 DIAGNOSIS — O24113 Pre-existing diabetes mellitus, type 2, in pregnancy, third trimester: Secondary | ICD-10-CM | POA: Diagnosis present

## 2022-01-19 DIAGNOSIS — Z87891 Personal history of nicotine dependence: Secondary | ICD-10-CM

## 2022-01-19 DIAGNOSIS — E119 Type 2 diabetes mellitus without complications: Secondary | ICD-10-CM

## 2022-01-19 DIAGNOSIS — O24919 Unspecified diabetes mellitus in pregnancy, unspecified trimester: Secondary | ICD-10-CM | POA: Insufficient documentation

## 2022-01-19 LAB — PROTEIN / CREATININE RATIO, URINE
Creatinine, Urine: 80.8 mg/dL
Protein Creatinine Ratio: 0.38 mg/mg{Cre} — ABNORMAL HIGH (ref 0.00–0.15)
Total Protein, Urine: 31 mg/dL

## 2022-01-19 LAB — COMPREHENSIVE METABOLIC PANEL
ALT: 12 U/L (ref 0–44)
AST: 23 U/L (ref 15–41)
Albumin: 2.4 g/dL — ABNORMAL LOW (ref 3.5–5.0)
Alkaline Phosphatase: 84 U/L (ref 38–126)
Anion gap: 9 (ref 5–15)
BUN: 8 mg/dL (ref 6–20)
CO2: 22 mmol/L (ref 22–32)
Calcium: 8.3 mg/dL — ABNORMAL LOW (ref 8.9–10.3)
Chloride: 104 mmol/L (ref 98–111)
Creatinine, Ser: 0.54 mg/dL (ref 0.44–1.00)
GFR, Estimated: >60 mL/min (ref >60)
Glucose, Bld: 85 mg/dL (ref 70–99)
Potassium: 3.9 mmol/L (ref 3.5–5.1)
Sodium: 135 mmol/L (ref 135–145)
Total Bilirubin: 0.8 mg/dL (ref 0.3–1.2)
Total Protein: 5.7 g/dL — ABNORMAL LOW (ref 6.5–8.1)

## 2022-01-19 LAB — CBC
HCT: 32.1 % — ABNORMAL LOW (ref 36.0–46.0)
Hemoglobin: 10.6 g/dL — ABNORMAL LOW (ref 12.0–15.0)
MCH: 29.2 pg (ref 26.0–34.0)
MCHC: 33 g/dL (ref 30.0–36.0)
MCV: 88.4 fL (ref 80.0–100.0)
Platelets: 253 10*3/uL (ref 150–400)
RBC: 3.63 MIL/uL — ABNORMAL LOW (ref 3.87–5.11)
RDW: 12.9 % (ref 11.5–15.5)
WBC: 8.5 10*3/uL (ref 4.0–10.5)
nRBC: 0 % (ref 0.0–0.2)

## 2022-01-19 LAB — RPR: RPR Ser Ql: NONREACTIVE

## 2022-01-19 LAB — GLUCOSE, CAPILLARY
Glucose-Capillary: 83 mg/dL (ref 70–99)
Glucose-Capillary: 184 mg/dL — ABNORMAL HIGH (ref 70–99)
Glucose-Capillary: 134 mg/dL — ABNORMAL HIGH (ref 70–99)
Glucose-Capillary: 205 mg/dL — ABNORMAL HIGH (ref 70–99)
Glucose-Capillary: 152 mg/dL — ABNORMAL HIGH (ref 70–99)
Glucose-Capillary: 161 mg/dL — ABNORMAL HIGH (ref 70–99)
Glucose-Capillary: 177 mg/dL — ABNORMAL HIGH (ref 70–99)
Glucose-Capillary: 129 mg/dL — ABNORMAL HIGH (ref 70–99)

## 2022-01-19 MED ORDER — FENTANYL CITRATE (PF) 100 MCG/2ML IJ SOLN
100.0000 ug | INTRAMUSCULAR | Status: DC | PRN
  Administered 2022-01-19 – 2022-01-20 (×6): 100 ug via INTRAVENOUS
  Filled 2022-01-19 (×6): qty 2

## 2022-01-19 MED ORDER — SOD CITRATE-CITRIC ACID 500-334 MG/5ML PO SOLN: 30.0000 mL | ORAL | Status: DC | PRN

## 2022-01-19 MED ORDER — TERBUTALINE SULFATE 1 MG/ML IJ SOLN: 0.2500 mg | Freq: Once | INTRAMUSCULAR | Status: DC | PRN

## 2022-01-19 MED ORDER — OXYTOCIN BOLUS FROM INFUSION
333.0000 mL | Freq: Once | INTRAVENOUS | Status: DC
Start: 2022-01-19 — End: 2022-01-20

## 2022-01-19 MED ORDER — LACTATED RINGERS IV SOLN: 500.0000 mL | INTRAVENOUS | Status: DC | PRN

## 2022-01-19 MED ORDER — LACTATED RINGERS IV SOLN: INTRAVENOUS | Status: DC

## 2022-01-19 MED ORDER — INSULIN REGULAR(HUMAN) IN NACL 100-0.9 UT/100ML-% IV SOLN
INTRAVENOUS | Status: DC
  Administered 2022-01-19: 2.2 [IU]/h via INTRAVENOUS
  Filled 2022-01-19: qty 100

## 2022-01-19 MED ORDER — MISOPROSTOL 50MCG HALF TABLET
50.0000 ug | ORAL_TABLET | ORAL | Status: DC | PRN
  Administered 2022-01-19 (×2): 50 ug via BUCCAL
  Filled 2022-01-19 (×2): qty 1

## 2022-01-19 MED ORDER — ONDANSETRON HCL 4 MG/2ML IJ SOLN
4.0000 mg | Freq: Four times a day (QID) | INTRAMUSCULAR | Status: DC | PRN
  Administered 2022-01-20: 4 mg via INTRAVENOUS
  Filled 2022-01-19: qty 2

## 2022-01-19 MED ORDER — FLUOXETINE HCL 20 MG PO CAPS
40.0000 mg | ORAL_CAPSULE | Freq: Every day | ORAL | Status: DC
  Administered 2022-01-19: 40 mg via ORAL
  Filled 2022-01-19 (×2): qty 2

## 2022-01-19 MED ORDER — INSULIN DETEMIR 100 UNIT/ML ~~LOC~~ SOLN
10.0000 [IU] | Freq: Two times a day (BID) | SUBCUTANEOUS | Status: DC
  Filled 2022-01-19 (×3): qty 0.1

## 2022-01-19 MED ORDER — LIDOCAINE HCL (PF) 1 % IJ SOLN
30.0000 mL | INTRAMUSCULAR | Status: AC | PRN
  Administered 2022-01-20: 30 mL via SUBCUTANEOUS
  Filled 2022-01-19: qty 30

## 2022-01-19 MED ORDER — OXYTOCIN-SODIUM CHLORIDE 30-0.9 UT/500ML-% IV SOLN
1.0000 m[IU]/min | INTRAVENOUS | Status: DC
  Administered 2022-01-19: 2 m[IU]/min via INTRAVENOUS
  Filled 2022-01-19: qty 500

## 2022-01-19 MED ORDER — LEVONORGESTREL 20.1 MCG/DAY IU IUD
1.0000 | INTRAUTERINE_SYSTEM | Freq: Once | INTRAUTERINE | Status: AC
  Administered 2022-01-20: 1 via INTRAUTERINE
  Filled 2022-01-19: qty 1

## 2022-01-19 MED ORDER — DEXTROSE IN LACTATED RINGERS 5 % IV SOLN: INTRAVENOUS | Status: DC

## 2022-01-19 MED ORDER — ACETAMINOPHEN 325 MG PO TABS: 650.0000 mg | ORAL_TABLET | ORAL | Status: DC | PRN

## 2022-01-19 MED ORDER — DEXTROSE 50 % IV SOLN: 0.0000 mL | INTRAVENOUS | Status: DC | PRN

## 2022-01-19 MED ORDER — OXYTOCIN-SODIUM CHLORIDE 30-0.9 UT/500ML-% IV SOLN: 2.5000 [IU]/h | INTRAVENOUS | Status: DC

## 2022-01-19 MED ORDER — INSULIN ASPART 100 UNIT/ML IJ SOLN
0.0000 [IU] | INTRAMUSCULAR | Status: DC
  Administered 2022-01-19: 2 [IU] via SUBCUTANEOUS
  Administered 2022-01-19: 4 [IU] via SUBCUTANEOUS

## 2022-01-19 NOTE — Progress Notes (Signed)
Labor Progress Note Sherry Campbell is a 36 y.o. G1P0 at [redacted]w[redacted]d who presented for IOL due to T2DM.  S: Reports feeling more contractions, pain improved with prn fentanyl.  O:  BP (!) 126/56 (BP Location: Left Arm)    Pulse 75    Temp 97.9 F (36.6 C) (Oral)    Resp 14    Ht 5\' 2"  (1.575 m)    Wt 87.2 kg    LMP 05/05/2021    SpO2 100%    BMI 35.17 kg/m  EFM: Baseline FHR 120 bpm/moderate variability/+accels, no decels UC: Painfully contracting every 3-4 minutes per RN  CVE: Dilation: 1 Effacement (%): Thick Station: Ballotable Presentation: Vertex Exam by:: Dr. [email or web address]   A&P: 36 y.o. G1P0 104w0d here for IOL secondary to poorly controlled pre-existing T2DM.  #Labor: Progressing well. Foley balloon remains in place. Will continue with buccal cytotec for cervical ripening. Will reassess in 4 hours.  Plan for transition to Pitocin and AROM when appropriate. #Pain: PRN, epidural as last resort #FWB: Category I #GBS negative  #T2DM: Elevated CBGs improved with SSI. Will continue Levemir and SSI while in labor. Continue glucose checks q4hr during latent labor.   [redacted]w[redacted]d, DO PGY-1 01/19/2022, 3:27 PM

## 2022-01-19 NOTE — H&P (Addendum)
OBSTETRIC ADMISSION HISTORY AND PHYSICAL  Sherry Campbell is a 36 y.o. female G1P0 with IUP at 61w0dby LMP presenting for IOL due to T2DM (on Metformin and insulin). She reports +FMs, no LOF, no VB, no blurry vision, headaches, peripheral edema, or RUQ pain.  She plans on breast feeding. She requests post-placental levonorgestrel IUD for birth control.  [facility]    Dating: By LMP --->  Estimated Date of Delivery: 02/09/22  Sono:   '@[redacted]w[redacted]d' , CWD, normal anatomy, cephalic presentation, anterior placental lie, 2290 g, 64% EFW  Prenatal History/Complications:  --Type 2 Diabetes Mellitus (On Metformin and Levermir) --MDD (On Prozac)  Past Medical History: Past Medical History:  Diagnosis Date   Anxiety    Anxiety    Depression    Depression    Diabetes mellitus without complication ([facility]    dx April 2022 Type 2   Dysplasia of cervix, low grade (CIN 1)    Syncope    saw a cardiologist resolved without intervention   UTI (urinary tract infection)     Past Surgical History: Past Surgical History:  Procedure Laterality Date   WISDOM TOOTH EXTRACTION      Obstetrical History: OB History     Gravida  1   Para      Term      Preterm      AB      Living         SAB      IAB      Ectopic      Multiple      Live Births              Social History Social History   Socioeconomic History   Marital status: Single    Spouse name: Not on file   Number of children: Not on file   Years of education: Not on file   Highest education level: Not on file  Occupational History    Comment: Bartender  Tobacco Use   Smoking status: Former    Types: Cigarettes    Quit date: 11/14/2017    Years since quitting: 4.1   Smokeless tobacco: Never   Tobacco comments:    Quit 2019  Vaping Use   Vaping Use: Former   Devices: 2020- briefly  Substance and Sexual Activity   Alcohol use: Not Currently    Comment: SOCIALLY   Drug use: No    Sexual activity: Yes    Comment: INTERCOURSE AGE 65, SEXUAL PARTNERS MORE THAN 5  Other Topics Concern   Not on file  Social History Narrative   Not on file   Social Determinants of Health   Financial Resource Strain: Not on file  Food Insecurity: No Food Insecurity   Worried About [place] Last Year: Never true   [place] the Last Year: Never true  Transportation Needs: No Transportation Needs   Lack of Transportation (Medical): No   Lack of Transportation (Non-Medical): No  Physical Activity: Not on file  Stress: Not on file  Social Connections: Not on file    Family History: Family History  Problem Relation Age of Onset   Cancer Mother        breast, stage 1 summer 2022   Kidney disease Mother        post translplant   Diabetes Mother    Hypertension Mother    Breast cancer Mother 534  Other  Mother        BRCA2 mutation   Hyperthyroidism Mother    Diabetes Father    Breast cancer Paternal Aunt        dx. 47s   Diabetes Maternal Grandmother    Diabetes Maternal Grandfather    Heart disease Maternal Grandfather    Diabetes Paternal Grandmother    Diabetes Paternal Grandfather    Bone cancer Paternal Grandfather        dx. 2s   Ovarian cancer Maternal Great-grandmother 89    Allergies: No Known Allergies  Medications Prior to Admission  Medication Sig Dispense Refill Last Dose   aspirin 81 MG chewable tablet Chew by mouth daily.   01/18/2022   Continuous Blood Gluc Sensor (DEXCOM G6 SENSOR) MISC Use one sensor every ten days as instructed 3 each 5 01/18/2022   FLUoxetine (PROZAC) 40 MG capsule Take 1 capsule (40 mg total) by mouth daily. 30 capsule 6 01/18/2022   insulin aspart (NOVOLOG) 100 UNIT/ML injection Inject 50 Units into the skin 3 (three) times daily before meals. 10 mL 3 01/18/2022   Insulin Syringe-Needle U-100 (INSULIN SYRINGE 1CC/31GX5/16") 31G X 5/16" 1 ML MISC 1 Device by Does not apply route 3 (three) times daily before  meals. 100 each 1 01/18/2022   LEVEMIR FLEXTOUCH 100 UNIT/ML FlexTouch Pen Inject 10 Units into the skin 2 (two) times daily. (Patient taking differently: Inject 15 Units into the skin 2 (two) times daily.) 15 mL 1 01/19/2022   metFORMIN (GLUCOPHAGE-XR) 500 MG 24 hr tablet Take 2 tablets (1,000 mg total) by mouth in the morning and at bedtime. 120 tablet 3 01/18/2022   Prenatal MV & Min w/FA-DHA (PRENATAL ADULT GUMMY/DHA/FA PO) Take by mouth.   01/18/2022   Miconazole Nitrate Applicator (MONISTAT 7 COMBO PACK APP) 100 & 2 MG-% (9GM) KIT Place vaginally.      Review of Systems  All systems reviewed and negative except as stated in HPI  Blood pressure 103/68, pulse (!) 104, temperature 98.3 F (36.8 C), temperature source Oral, resp. rate 14, height '5\' 2"'  (1.575 m), weight 87.2 kg, last menstrual period 05/05/2021, SpO2 99 %.  General appearance: Alert, cooperative and no distress Lungs: Normal work of breathing on room air Heart: Normal rate, warm and well perfused  Abdomen: Soft, non-tender; gravid Extremities: No LE edema, no calf tenderness to palpation  Presentation: Cephalic  Fetal monitoring: Baseline FHR 135 bpm, moderate variability, +accels, no decels Uterine activity: Irregular Dilation: 1 Effacement (%): Thick Station: Ballotable Exam by:: Dr. Gwenlyn Perking  Prenatal labs: ABO, Rh: O + Antibody: Negative Rubella: Immune RPR: Non Reactive (01/20 1130)  HBsAg: Negative HIV: Non Reactive (01/20 1130)  GBS: Negative/-- (02/08 1712)  Pre-existing T2DM, not well controlled Genetic screening: Declined Anatomy US: Normal  Prenatal Transfer Tool  Maternal Diabetes: Yes:  Diabetes Type:  Pre-pregnancy, Insulin/Medication controlled Genetic Screening: Declined Maternal Ultrasounds/Referrals: Normal Fetal Ultrasounds or other Referrals:  None Maternal Substance Abuse:  No Significant Maternal Medications:  Meds include: Other: Metformin, Levemir, Prozac Significant Maternal Lab  Results: Group B Strep negative  Results for orders placed or performed during the hospital encounter of 01/19/22 (from the past 24 hour(s))  Type and screen   Collection Time: 01/19/22  8:15 AM  Result Value Ref Range   ABO/RH(D) PENDING    Antibody Screen NEG    Sample Expiration      01/22/2022,2359 Performed at [facility], [place] [address], [place], [place] [address]   Glucose, capillary  Collection Time: 01/19/22  8:21 AM  Result Value Ref Range   Glucose-Capillary 83 70 - 99 mg/dL  CBC   Collection Time: 01/19/22  8:23 AM  Result Value Ref Range   WBC 8.5 4.0 - 10.5 K/uL   RBC 3.63 (L) 3.87 - 5.11 MIL/uL   Hemoglobin 10.6 (L) 12.0 - 15.0 g/dL   HCT 32.1 (L) 36.0 - 46.0 %   MCV 88.4 80.0 - 100.0 fL   MCH 29.2 26.0 - 34.0 pg   MCHC 33.0 30.0 - 36.0 g/dL   RDW 12.9 11.5 - 15.5 %   Platelets 253 150 - 400 K/uL   nRBC 0.0 0.0 - 0.2 %  Comprehensive metabolic panel   Collection Time: 01/19/22  8:23 AM  Result Value Ref Range   Sodium 135 135 - 145 mmol/L   Potassium 3.9 3.5 - 5.1 mmol/L   Chloride 104 98 - 111 mmol/L   CO2 22 22 - 32 mmol/L   Glucose, Bld 85 70 - 99 mg/dL   BUN 8 6 - 20 mg/dL   Creatinine, Ser 0.54 0.44 - 1.00 mg/dL   Calcium 8.3 (L) 8.9 - 10.3 mg/dL   Total Protein 5.7 (L) 6.5 - 8.1 g/dL   Albumin 2.4 (L) 3.5 - 5.0 g/dL   AST 23 15 - 41 U/L   ALT 12 0 - 44 U/L   Alkaline Phosphatase 84 38 - 126 U/L   Total Bilirubin 0.8 0.3 - 1.2 mg/dL   GFR, Estimated >60 >60 mL/min   Anion gap 9 5 - 15    Patient Active Problem List   Diagnosis Date Noted   Diabetes mellitus affecting pregnancy 01/19/2022   Pre-existing type 2 diabetes mellitus in pregnancy in third trimester 12/01/2021   Supervision of high risk pregnancy, antepartum 09/07/2021   Family history of BRCA2 gene positive 08/26/2021   Family history of breast cancer 08/26/2021   Type 2 diabetes mellitus without complication, without long-term current use of insulin ([place]) 06/26/2021    Anxiety 07/05/2015   Depression 07/05/2015    Assessment/Plan:  Sherry Campbell is a 36 y.o. G1P0 at 37w0dhere for IOL secondary to poorly controlled pre-existing T2DM.   #Labor: Will initiate induction with buccal Cytotec. Foley balloon placed without difficulty. Will reassess in 4 hours.  #Pain: PRN, epidural as last resort #FWB: Category I #ID: GBS negative #MOF: Breast #MOC: Post-placental LNG IUD, will have patient sign consent  #T2DM: Home regimen includes Metformin 1000 mg BID, Levemir 10u BID, and Aspart 10u TIDAC. FBG 85 this morning. Patient did not take her Metformin this morning but took Levemir. Hold Metformin while inpatient. Will continue Levemir and SSI while in labor. Will allow carb consistent diet during latent labor. Plan for glucose checks q4hr during latent labor, then transition to q2hr during active labor. Will adjust insulin regimen accordingly. EFW 64% at 331w1d #MDD: Well-controlled on Fluoxetine 40 mg. Will continue here.   SaVennie HomansDO PGY-1 01/19/2022, 9:56 AM  GME ATTESTATION:  I saw and evaluated the patient. I agree with the findings and the plan of care as documented in the residents note. I have made changes to documentation as necessary.  IOL for T2DM. EFW average. Induction started with Cytotec and foley balloon placement. Cat 1 tracing. Will continue home Levemir on labor and delivery with SSI as needed. Will continue to monitor closely.   ChVilma MeckelMD OB Fellow, [facility] [place]/16/2023 1:00 PM

## 2022-01-19 NOTE — Progress Notes (Signed)
ADA Standards of Care 2023 Diabetes in Pregnancy Target Glucose Ranges:  Fasting: 70 - 95 mg/dL 1 hr postprandial:  194 - 140mg /dL (from first bite of meal) 2 hr postprandial:  100 - 120 mg/dL (from first bit of meal)   IOL due to DM (uncontrolled) Home meds:   Novolog 0-76 units tid with meals Levemir 15 units bid Metformin 1000 mg bid  Hospital meds: Levemir 10 units bid Novolog 0-14 units q 4 hours  Recommendations:  Note patient being induced.  Note Levemir is ordered and patient will receive tonight's dose.  May consider IV insulin tomorrow (and d/c basal/correction)- If CBG's>120 mg/dL.   Thanks,  , RN, BC-ADM Inpatient Diabetes Coordinator Pager [phone]  (8a-5p)

## 2022-01-19 NOTE — Progress Notes (Addendum)
Sherry Campbell is a 36 y.o. G1P0 at [redacted]w[redacted]d  admitted for induction of labor due to Gestational diabetes (A2GDM on insulin and Metformin)  Subjective: Reports doing well. Starting to feel contractions more. Coping well with pain and wants to hold off on next dose of IV pain medications at this time.   Objective: BP 122/67    Pulse 68    Temp 98.3 F (36.8 C) (Oral)    Resp 16    Ht 5\' 2"  (1.575 m)    Wt 87.2 kg    LMP 05/05/2021    SpO2 98%    BMI 35.17 kg/m  No intake/output data recorded. No intake/output data recorded.  FHT:  FHR: 140 bpm, variability: moderate,  accelerations:  Abscent,  decelerations:  Absent UC:   regular, every 1-2 minutes SVE:   Dilation: 4 Effacement (%): 50 Station: -3 Exam by:: Dr. [email or web address]  Labs: Lab Results  Component Value Date   WBC 8.5 01/19/2022   HGB 10.6 (L) 01/19/2022   HCT 32.1 (L) 01/19/2022   MCV 88.4 01/19/2022   PLT 253 01/19/2022    Assessment / Plan: G1P0 at [redacted]w[redacted]d  admitted for induction of labor due to Gestational diabetes (A2GDM on insulin and Metformin)  Labor:  s/p cytotec and FB. Now on pitocin. Will continue pitocin and at next check assess for AROM. Discussed plan with patient and patient is amenable  Fetal Wellbeing:  Category I Pain Control:  IV pain meds, may switch to nitrous later. Only wants epidural as last resort. I/D:   GBS neg   #T2DM BG above goal in 160s-200s. Started on endotool.   [redacted]w[redacted]d 01/19/2022, 9:09 PM

## 2022-01-19 NOTE — Progress Notes (Signed)
Labor Progress Note Sherry Campbell is a 36 y.o. G1P0 at [redacted]w[redacted]d presented for IOL due to T2DM.  S: Resting comfortably in bed, feeling increased fluid.  O:  BP (!) 126/53    Pulse 77    Temp 98.3 F (36.8 C) (Oral)    Resp 16    Ht 5\' 2"  (1.575 m)    Wt 87.2 kg    LMP 05/05/2021    SpO2 98%    BMI 35.17 kg/m  EFM: Baseline FHR 150 bpm/moderate variability/no accels currently, no decels  CVE: Dilation: 4 Effacement (%): 50 Station: -3 Presentation: Vertex Exam by:: Dr. [email or web address]   A&P: 36 y.o. G1P0 [redacted]w[redacted]d here for IOL secondary to poorly controlled pre-existing T2DM.   #Labor: Progressing well s/p cytotec x2. Bloody show, membrane remains intact. Will transition to Pitocin 2x2 for augmentation. Plan for AROM when appropriate.  #Pain: PRN, epidural as last resort #FWB: Cat I #GBS negative  #T2DM: Blood glucose remains elevated, last check 205. Will transition to Endotool for insulin drip dosing and titration to improve glucose control.   [redacted]w[redacted]d, DO PGY-1 01/19/2022, 7:00 PM

## 2022-01-19 NOTE — Progress Notes (Addendum)
Sherry Campbell is a 36 y.o. G1P0 at [redacted]w[redacted]d admitted for induction of labor due to T2DM.  Subjective: Reports contractions getting somewhat more intense. IV pain medications helping.   Discussed post placental IUD with patient. Would like PP Liletta. Discussed risks/benefits/alternatives.   Objective: BP (!) 105/46    Pulse 66    Temp 98.9 F (37.2 C) (Oral)    Resp 16    Ht 5\' 2"  (1.575 m)    Wt 87.2 kg    LMP 05/05/2021    SpO2 98%    BMI 35.17 kg/m  No intake/output data recorded. No intake/output data recorded.  FHT:  FHR: 130 bpm, variability: moderate,  accelerations:  Present 10x10,  decelerations:  Absent UC:   regular, every 1-2 minutes SVE:   Dilation: 5 Effacement (%): 60, 70 Station: -1 Exam by:: Dr. [email or web address]  Labs: Lab Results  Component Value Date   WBC 8.5 01/19/2022   HGB 10.6 (L) 01/19/2022   HCT 32.1 (L) 01/19/2022   MCV 88.4 01/19/2022   PLT 253 01/19/2022    Assessment / Plan: G1P0 at [redacted]w[redacted]d admitted for induction of labor due to Gestational diabetes.  Labor:  On pitocin. Cervix 3 cm and further effaced. Head also with descent to -1 station. AROMed during current check. Tolerated well by fetus and patient. Patient would like to get out of bed and walk or get on the ball.  Fetal Wellbeing:  Category I Pain Control:  IV pain meds and then nitrous once more than 8 cm I/D:   GBS neg   #Contraception Would like ppIUD placed. Discussed R/B/A. Discussed contraindications of PPIUD including infection. Patient expressed understanding. Signed consent form. Patient plans to avoid epidural and therefore discussed we can do IV Fentanyl for pain management during PP IUD placement. [redacted]w[redacted]d 01/19/2022, 11:38 PM

## 2022-01-20 ENCOUNTER — Encounter (HOSPITAL_COMMUNITY): Payer: Self-pay | Admitting: Family Medicine

## 2022-01-20 ENCOUNTER — Other Ambulatory Visit: Payer: Self-pay | Admitting: Obstetrics & Gynecology

## 2022-01-20 DIAGNOSIS — O2412 Pre-existing diabetes mellitus, type 2, in childbirth: Secondary | ICD-10-CM

## 2022-01-20 DIAGNOSIS — Z3A37 37 weeks gestation of pregnancy: Secondary | ICD-10-CM

## 2022-01-20 DIAGNOSIS — Z30430 Encounter for insertion of intrauterine contraceptive device: Secondary | ICD-10-CM

## 2022-01-20 DIAGNOSIS — O24113 Pre-existing diabetes mellitus, type 2, in pregnancy, third trimester: Secondary | ICD-10-CM

## 2022-01-20 LAB — GLUCOSE, CAPILLARY
Glucose-Capillary: 115 mg/dL — ABNORMAL HIGH (ref 70–99)
Glucose-Capillary: 116 mg/dL — ABNORMAL HIGH (ref 70–99)
Glucose-Capillary: 121 mg/dL — ABNORMAL HIGH (ref 70–99)
Glucose-Capillary: 122 mg/dL — ABNORMAL HIGH (ref 70–99)
Glucose-Capillary: 125 mg/dL — ABNORMAL HIGH (ref 70–99)
Glucose-Capillary: 131 mg/dL — ABNORMAL HIGH (ref 70–99)
Glucose-Capillary: 134 mg/dL — ABNORMAL HIGH (ref 70–99)
Glucose-Capillary: 139 mg/dL — ABNORMAL HIGH (ref 70–99)
Glucose-Capillary: 149 mg/dL — ABNORMAL HIGH (ref 70–99)

## 2022-01-20 LAB — TYPE AND SCREEN
ABO/RH(D): O POS
Antibody Screen: NEGATIVE

## 2022-01-20 MED ORDER — ONDANSETRON HCL 4 MG/2ML IJ SOLN: 4.0000 mg | INTRAMUSCULAR | Status: DC | PRN

## 2022-01-20 MED ORDER — BENZOCAINE-MENTHOL 20-0.5 % EX AERO
1.0000 "application " | INHALATION_SPRAY | CUTANEOUS | Status: DC | PRN
  Administered 2022-01-20: 1 via TOPICAL
  Filled 2022-01-20: qty 56

## 2022-01-20 MED ORDER — DIBUCAINE (PERIANAL) 1 % EX OINT: 1.0000 "application " | TOPICAL_OINTMENT | CUTANEOUS | Status: DC | PRN

## 2022-01-20 MED ORDER — SENNOSIDES-DOCUSATE SODIUM 8.6-50 MG PO TABS
2.0000 | ORAL_TABLET | Freq: Every day | ORAL | Status: DC
  Administered 2022-01-21: 2 via ORAL
  Filled 2022-01-20: qty 2

## 2022-01-20 MED ORDER — MEDROXYPROGESTERONE ACETATE 150 MG/ML IM SUSP
150.0000 mg | INTRAMUSCULAR | Status: DC | PRN
Start: 2022-01-20 — End: 2022-01-21

## 2022-01-20 MED ORDER — ACETAMINOPHEN 325 MG PO TABS: 650.0000 mg | ORAL_TABLET | ORAL | Status: DC | PRN

## 2022-01-20 MED ORDER — OXYTOCIN 10 UNIT/ML IJ SOLN
INTRAMUSCULAR | Status: AC
  Administered 2022-01-20: 10 [IU]
  Filled 2022-01-20: qty 1

## 2022-01-20 MED ORDER — OXYTOCIN 10 UNIT/ML IJ SOLN: 10.0000 [IU] | Freq: Once | INTRAMUSCULAR | Status: DC

## 2022-01-20 MED ORDER — METFORMIN HCL ER 750 MG PO TB24
2000.0000 mg | ORAL_TABLET | Freq: Every day | ORAL | Status: DC
  Filled 2022-01-20 (×2): qty 1

## 2022-01-20 MED ORDER — ONDANSETRON HCL 4 MG PO TABS: 4.0000 mg | ORAL_TABLET | ORAL | Status: DC | PRN

## 2022-01-20 MED ORDER — DIPHENHYDRAMINE HCL 25 MG PO CAPS: 25.0000 mg | ORAL_CAPSULE | Freq: Four times a day (QID) | ORAL | Status: DC | PRN

## 2022-01-20 MED ORDER — TETANUS-DIPHTH-ACELL PERTUSSIS 5-2.5-18.5 LF-MCG/0.5 IM SUSY: 0.5000 mL | PREFILLED_SYRINGE | Freq: Once | INTRAMUSCULAR | Status: DC

## 2022-01-20 MED ORDER — COCONUT OIL OIL: 1.0000 "application " | TOPICAL_OIL | Status: DC | PRN

## 2022-01-20 MED ORDER — WITCH HAZEL-GLYCERIN EX PADS: 1.0000 "application " | MEDICATED_PAD | CUTANEOUS | Status: DC | PRN

## 2022-01-20 MED ORDER — MEASLES, MUMPS & RUBELLA VAC IJ SOLR: 0.5000 mL | Freq: Once | INTRAMUSCULAR | Status: DC

## 2022-01-20 MED ORDER — IBUPROFEN 600 MG PO TABS
600.0000 mg | ORAL_TABLET | Freq: Four times a day (QID) | ORAL | Status: DC
  Administered 2022-01-20 – 2022-01-21 (×4): 600 mg via ORAL
  Filled 2022-01-20 (×4): qty 1

## 2022-01-20 MED ORDER — SIMETHICONE 80 MG PO CHEW: 80.0000 mg | CHEWABLE_TABLET | ORAL | Status: DC | PRN

## 2022-01-20 MED ORDER — PRENATAL MULTIVITAMIN CH
1.0000 | ORAL_TABLET | Freq: Every day | ORAL | Status: DC
  Administered 2022-01-21: 1 via ORAL
  Filled 2022-01-20: qty 1

## 2022-01-20 NOTE — Discharge Summary (Signed)
Postpartum Discharge Summary     Patient Name: Sherry Campbell DOB: May 01, 1986 MRN: [id number]  Date of admission: 01/19/2022 Delivery date:01/20/2022  Delivering provider: Renard Matter  Date of discharge: 01/21/2022  Admitting diagnosis: Diabetes mellitus affecting pregnancy [O24.919] Intrauterine pregnancy: [redacted]w[redacted]d    Secondary diagnosis:  Active Problems:   Anxiety   Depression   Type 2 diabetes mellitus without complication, without long-term current use of insulin (HCC)   Supervision of high risk pregnancy, antepartum   Pre-existing type 2 diabetes mellitus in pregnancy in third trimester   Spontaneous vaginal delivery   Vaginal delivery  Additional problems: None    Discharge diagnosis: Term Pregnancy Delivered and Type 2 DM                                              Post partum procedures: PP IUD placed Augmentation: AROM, Pitocin, Cytotec, and IP Foley Complications: None  Hospital course: Induction of Labor With Vaginal Delivery   36y.o. yo G1P0 at 343w1das admitted to the hospital 01/19/2022 for induction of labor.  Indication for induction: TYPE 2 DM.  Patient had an uncomplicated labor course as follows: Membrane Rupture Time/Date: 11:20 PM ,01/20/2022   Delivery Method:Vaginal, Spontaneous  Episiotomy: None  Lacerations:  1st degree;Perineal  Details of delivery can be found in separate delivery note.  Patient had a routine postpartum course. Her fasting BG was 130 and since patient was on Metformin prior to pregnancy she was started back on her Metformin (dose unchanged during pregnancy) at discharge.  Patient is discharged home 01/21/22.  Newborn Data: Birth date:01/20/2022  Birth time:8:35 AM  Gender:Female  Living status:Living  Apgars:9 ,10  Weight:2946 g   Magnesium Sulfate received: No BMZ received: No Rhophylac:N/A MMR:N/A T-DaP:received prenatally Flu: received prenatally (at her job) Transfusion:No  Physical exam  Vitals:   01/20/22 1548  01/20/22 2031 01/21/22 0021 01/21/22 0546  BP: (!) 115/57 (!) 115/54 (!) 101/56 (!) 103/57  Pulse: 70 85 82 88  Resp: '15  18 18  ' Temp: 98.7 F (37.1 C) 98.3 F (36.8 C) 98 F (36.7 C) 97.9 F (36.6 C)  TempSrc: Oral Oral Oral Oral  SpO2: 98%     Weight:      Height:       General: alert Lochia: appropriate Uterine Fundus: firm Incision: N/A DVT Evaluation: No evidence of DVT seen on physical exam. Labs: Lab Results  Component Value Date   WBC 8.5 01/19/2022   HGB 10.6 (L) 01/19/2022   HCT 32.1 (L) 01/19/2022   MCV 88.4 01/19/2022   PLT 253 01/19/2022   CMP Latest Ref Rng & Units 01/21/2022  Glucose 70 - 99 mg/dL 175(H)  BUN 6 - 20 mg/dL -  Creatinine 0.44 - 1.00 mg/dL -  Sodium 135 - 145 mmol/L -  Potassium 3.5 - 5.1 mmol/L -  Chloride 98 - 111 mmol/L -  CO2 22 - 32 mmol/L -  Calcium 8.9 - 10.3 mg/dL -  Total Protein 6.5 - 8.1 g/dL -  Total Bilirubin 0.3 - 1.2 mg/dL -  Alkaline Phos 38 - 126 U/L -  AST 15 - 41 U/L -  ALT 0 - 44 U/L -   Edinburgh Score: Edinburgh Postnatal Depression Scale Screening Tool 01/20/2022  I have been able to laugh and see the funny side of things. (No Data)  After visit meds:  Allergies as of 01/21/2022   No Known Allergies      Medication List     STOP taking these medications    aspirin 81 MG chewable tablet   insulin aspart 100 UNIT/ML injection Commonly known as: NovoLOG   INSULIN SYRINGE 1CC/31GX5/16" 31G X 5/16" 1 ML Misc   Levemir FlexTouch 100 UNIT/ML FlexPen Generic drug: insulin detemir       TAKE these medications    acetaminophen 325 MG tablet Commonly known as: Tylenol Take 2 tablets (650 mg total) by mouth every 4 (four) hours as needed (for pain scale < 4).   Dexcom G6 Sensor Misc Use one sensor every ten days as instructed   FLUoxetine 40 MG capsule Commonly known as: PROzac Take 1 capsule (40 mg total) by mouth daily.   ibuprofen 600 MG tablet Commonly known as: ADVIL Take 1 tablet (600  mg total) by mouth every 6 (six) hours.   metFORMIN 500 MG 24 hr tablet Commonly known as: GLUCOPHAGE-XR Take 2 tablets (1,000 mg total) by mouth in the morning and at bedtime.   PRENATAL ADULT GUMMY/DHA/FA PO Take 1 tablet by mouth daily.         Discharge home in stable condition Infant Feeding: Breast Infant Disposition:home with mother Discharge instruction: per After Visit Summary and Postpartum booklet. Activity: Advance as tolerated. Pelvic rest for 6 weeks.  Diet: carb modified diet Future Appointments:No future appointments. Follow up Visit: Message sent to [name] by Dr. Cy Blamer on 2/17  Please schedule this patient for a In person postpartum visit in 4 weeks with the following provider: MD. Additional Postpartum F/U:Postpartum Depression checkup and 2 hour GTT  High risk pregnancy complicated by:  P3SU Delivery mode:  Vaginal, Spontaneous  Anticipated Birth Control:  PP IUD placed   Renard Matter, MD, MPH OB Fellow, Faculty Practice

## 2022-01-20 NOTE — Progress Notes (Signed)
Inpatient Diabetes Program Recommendations  ADA Standards of Care 2023 Diabetes in Pregnancy Target Glucose Ranges:  Fasting: 70 - 95 mg/dL 1 hr postprandial:  557 - 140mg /dL (from first bite of meal) 2 hr postprandial:  100 - 120 mg/dL (from first bit of meal)    AACE/ADA: New Consensus Statement on Inpatient Glycemic Control (2015)  Target Ranges:  Prepandial:   less than 140 mg/dL      Peak postprandial:   less than 180 mg/dL (1-2 hours)      Critically ill patients:  140 - 180 mg/dL   Lab Results  Component Value Date   GLUCAP 131 (H) 01/20/2022   HGBA1C 6.0 (H) 12/23/2021    Review of Glycemic Control  Diabetes history: DM 2 Outpatient Diabetes medications: Metformin prior to pregnancy Current orders for Inpatient glycemic control:  IV insulin Endotool pending delivery  Inpatient Diabetes Program Recommendations:    On metformin prior to pregnancy Postdelivery consider:  -   Starting Metformin 500 mg bid -   CBGs tid before meals and hs  Thanks,  12/25/2021 RN, MSN, BC-ADM Inpatient Diabetes Coordinator Team Pager [phone] (8a-5p)

## 2022-01-20 NOTE — Progress Notes (Signed)
Called to bedside as patient feeling more pressure and some some rectal pressure. Would like more IV pain medicine if not close to delivery  Cervix unchanged at 5 cm since last exam ~2 hrs ago  Ok to get IV pain medications.

## 2022-01-20 NOTE — Lactation Note (Signed)
This note was copied from a baby's chart. Lactation Consultation Note  Patient Name: Sherry Campbell [id number] Date: 01/20/2022 Reason for consult: L&D Initial assessment Age:35 hours   LC Note:  Spoke with RN prior to entering room.  Mother is sleeping now; will return later today for an initial consult.   Maternal Data Has patient been taught Hand Expression?: Yes Does the patient have breastfeeding experience prior to this delivery?: No  Feeding Mother's Current Feeding Choice: Breast Milk  LATCH Score Latch: Grasps breast easily, tongue down, lips flanged, rhythmical sucking.  Audible Swallowing: A few with stimulation  Type of Nipple: Everted at rest and after stimulation  Comfort (Breast/Nipple): Soft / non-tender  Hold (Positioning): Assistance needed to correctly position infant at breast and maintain latch.  LATCH Score: 8   Lactation Tools Discussed/Used    Interventions Interventions: Breast feeding basics reviewed;Assisted with latch  Discharge    Consult Status Consult Status: Follow-up from L&D    Sherry Campbell 01/20/2022, 12:57 PM

## 2022-01-20 NOTE — Lactation Note (Signed)
This note was copied from a baby's chart. Lactation Consultation Note  Patient Name: Sherry Campbell [id number] Date: 01/20/2022 Reason for consult: Initial assessment;Early term 37-38.6wks;Primapara;1st time breastfeeding;Maternal endocrine disorder;Other (Comment) (Hypoglycemia) Age:34 hours   P1 mother whose infant is now 41 hours old.  This is an ETI at 37+1 weeks.  Mother's current feeding preference is breast/donor breast milk due to low blood sugar.    Baby "[name]" had an initial blood glucose level of 50 mg/dl.  At the second lab draw the blood sugar was 37 mg/dl.  RN provided glucose gel and started donor breast milk.  Third blood sugar was 62 mg/dl.   "[name]" was swaddled and in father's arms asleep when I arrived.  Reviewed breast feeding basics with parents.  Taught mother hand expression and finger fed colostrum drops to baby.  Encouraged feeding at least every three hours due to gestational age and low blood sugars.  Offered to initiate the DEBP and mother receptive.  Pump parts, set up and cleaning reviewed.  Observed mother pumping for 15 minutes and two drops obtained after pumping.  Mother will continue to latch, supplement and post pump  every three hours or sooner if baby shows cues.  Suggested she call her RN/LC for latch assistance as needed.  Mother has a DEBP for home use.  Both parents receptive to learning.  RN updated.   Maternal Data Has patient been taught Hand Expression?: Yes Does the patient have breastfeeding experience prior to this delivery?: No  Feeding Mother's Current Feeding Choice: Breast Milk and Donor Milk Nipple Type: Slow - flow  LATCH Score                    Lactation Tools Discussed/Used Tools: Pump;Flanges Flange Size: 21 Breast pump type: Double-Electric Breast Pump;Manual Pump Education: Setup, frequency, and cleaning;Milk Storage Reason for Pumping: Breast stimulation for supplementation; low blood sugar Pumping frequency:  Every three hours Pumped volume:  (drops)  Interventions Interventions: Breast feeding basics reviewed;Education;LC Services brochure  Discharge Pump: DEBP;Manual;Personal WIC Program: No  Consult Status Consult Status: Follow-up Date: 01/21/22 Follow-up type: In-patient    Graison Leinberger R Alfredo Collymore 01/20/2022, 5:08 PM

## 2022-01-20 NOTE — Progress Notes (Signed)
Patient felt more pressure and feeling rectal pressure. Cervix checked by RN and 9/100/0.   Patient changing positions frequently and using nitrous. Contractions spaced out while on hands and knees (on floor with blankets under knees).   While changing positions, IV site with pitocin noted to be infiltrated and leaking. Pitocin infusion malfunctioned due to the IV situation. New IV placed.   - Pitocin was at 12. Unclear how long pitocin was not being infused for. Therefore restart pitocin at 6/hr and uptitrate as appropriate.

## 2022-01-20 NOTE — Progress Notes (Addendum)
Sherry Campbell is a 36 y.o. G1P0 at [redacted]w[redacted]d by  admitted for induction of labor due to T2DM  Subjective: Feeling more pressure and contractions more intense. Doesn't feel like IV pain medications are helping at this time. Would like to switch to nitrous  Objective: BP 130/64    Pulse 81    Temp 98.5 F (36.9 C) (Oral)    Resp 17    Ht 5\' 2"  (1.575 m)    Wt 87.2 kg    LMP 05/05/2021    SpO2 99%    BMI 35.17 kg/m  No intake/output data recorded. No intake/output data recorded.  FHT:  FHR: 120 bpm, variability: moderate,  accelerations:  Abscent,  decelerations:  Absent UC:   regular, every 2-3 minutes SVE:   Dilation: 7 Effacement (%): 80-90 Station: -1 Exam by:: Dr. [email or web address]  Labs: Lab Results  Component Value Date   WBC 8.5 01/19/2022   HGB 10.6 (L) 01/19/2022   HCT 32.1 (L) 01/19/2022   MCV 88.4 01/19/2022   PLT 253 01/19/2022    Assessment / Plan: G1P0 at [redacted]w[redacted]d by  admitted for induction of labor due to Gestational diabetes.  Labor: Progressing on Pitocin. Will continue current dose of pitocin as making cervical change and contracting every 2-72min  Fetal Wellbeing:  Category I, tracing at times discontinuous as patient is moving or sitting on ball for pain relief. When tracing cat I although accels only intermittently  Pain Control:  Nitrous Oxide I/D:   GBS neg   #T2DM On endotool. BG improved to 110s-120s. 1m 01/20/2022, 6:39 AM

## 2022-01-20 NOTE — Lactation Note (Signed)
This note was copied from a baby's chart. Lactation Consultation Note  Patient Name: [name] [id number] Date: 01/20/2022 Reason for consult: L&D Initial assessment Age:34 hours  Lactation attended to Sherry Campbell and her daughter in L&D and assisted with initial latch. We reviewed hand expression and two breast feeding positions: football and cross cradle holds. I involved her spouse in assisting Sherry Campbell to latch baby.  Maternal Data Has patient been taught Hand Expression?: Yes Does the patient have breastfeeding experience prior to this delivery?: No  Feeding Mother's Current Feeding Choice: Breast Milk  LATCH Score Latch: Grasps breast easily, tongue down, lips flanged, rhythmical sucking.  Audible Swallowing: A few with stimulation  Type of Nipple: Everted at rest and after stimulation  Comfort (Breast/Nipple): Soft / non-tender  Hold (Positioning): Assistance needed to correctly position infant at breast and maintain latch.  LATCH Score: 8   Interventions Interventions: Breast feeding basics reviewed;Assisted with latch;Skin to skin;Hand express;Breast compression;Adjust position;Education  Consult Status Consult Status: Follow-up from L&D    Walker Shadow 01/20/2022, 9:43 AM

## 2022-01-21 LAB — GLUCOSE, CAPILLARY: Glucose-Capillary: 130 mg/dL — ABNORMAL HIGH (ref 70–99)

## 2022-01-21 LAB — GLUCOSE, RANDOM: Glucose, Bld: 175 mg/dL — ABNORMAL HIGH (ref 70–99)

## 2022-01-21 MED ORDER — IBUPROFEN 600 MG PO TABS
600.0000 mg | ORAL_TABLET | Freq: Four times a day (QID) | ORAL | 0 refills | Status: DC
Start: 2022-01-21 — End: 2023-02-05

## 2022-01-21 MED ORDER — METFORMIN HCL ER 500 MG PO TB24
1000.0000 mg | ORAL_TABLET | Freq: Every day | ORAL | Status: DC
Start: 2022-01-21 — End: 2022-01-21
  Administered 2022-01-21: 1000 mg via ORAL
  Filled 2022-01-21: qty 2

## 2022-01-21 MED ORDER — ACETAMINOPHEN 325 MG PO TABS: 650.0000 mg | ORAL_TABLET | ORAL | 0 refills | Status: AC | PRN

## 2022-01-21 NOTE — Lactation Note (Signed)
This note was copied from a baby's chart. Lactation Consultation Note  Patient Name: Sherry Campbell [id number] Date: 01/21/2022 Reason for consult: Follow-up assessment Age:34 hours   P1 mother whose infant is now 24 hours old.  This is an ETI at 37+1 weeks.  Mother's current feeding preference is breast/donor breast milk.  Baby "Sherry Campbell" just finished her photography session when I arrived; grandmother preparing to give donor milk.  Mother had no further questions/concerns.  She plans to continue breast feeding followed by supplementation with formula and pumping for 15 minutes.  Mother will feed back any EBM she obtains prior to giving formula.  Parents have our OP phone number for any concerns after discharge.  Visitors present.   Maternal Data    Feeding    LATCH Score Latch: Repeated attempts needed to sustain latch, nipple held in mouth throughout feeding, stimulation needed to elicit sucking reflex.  Audible Swallowing: A few with stimulation  Type of Nipple: Everted at rest and after stimulation  Comfort (Breast/Nipple): Soft / non-tender  Hold (Positioning): Assistance needed to correctly position infant at breast and maintain latch.  LATCH Score: 7   Lactation Tools Discussed/Used    Interventions Interventions: Education;Breast feeding basics reviewed  Discharge Discharge Education: Engorgement and breast care  Consult Status Consult Status: Complete Date: 01/21/22 Follow-up type: Call as needed    Sherry Campbell 01/21/2022, 1:19 PM

## 2022-01-21 NOTE — Progress Notes (Signed)
MOB was referred for history of depression/anxiety. * Referral screened out by Clinical Social Worker because none of the following criteria appear to apply: ~ History of anxiety/depression during this pregnancy, or of post-partum depression following prior delivery. ~ Diagnosis of anxiety and/or depression within last 3 years OR * MOB's symptoms currently being treated with medication and/or therapy. Per chart review, MOB is currently prescribed/taking Fluoxetine.  Please contact the Clinical Social Worker if needs arise, by [facility] request, or if MOB scores greater than 9/yes to question 10 on Edinburgh Postpartum Depression Screen.  Celso Sickle, LCSW Clinical Social Worker [facility] Cell#: [phone]

## 2022-01-23 ENCOUNTER — Other Ambulatory Visit: Payer: Self-pay | Admitting: Obstetrics & Gynecology

## 2022-01-23 DIAGNOSIS — O24113 Pre-existing diabetes mellitus, type 2, in pregnancy, third trimester: Secondary | ICD-10-CM

## 2022-01-23 NOTE — BH Specialist Note (Signed)
Integrated Behavioral Health via Telemedicine Visit  01/23/2022 Sherry Campbell [id number]  Number of Integrated Behavioral Health Clinician visits: No data recorded Session Start time: No data recorded  Session End time: No data recorded Total time in minutes: No data recorded  Referring Provider: Edd Arbour, CNM Patient/Family location: Home [facility] Provider location: [facility]  All persons participating in visit: [name] and [facility] Karlee Staff   Types of Service: Individual psychotherapy and Video visit  I connected with Sherry Campbell  n/a  via  Telephone or Video Enabled Telemedicine Application  (Video is Caregility application) and verified that I am speaking with the correct person using two identifiers. Discussed confidentiality: Yes   I discussed the limitations of telemedicine and the availability of in person appointments.  Discussed there is a possibility of technology failure and discussed alternative modes of communication if that failure occurs.  I discussed that engaging in this telemedicine visit, they consent to the provision of behavioral healthcare and the services will be billed under their insurance.  Patient and/or legal guardian expressed understanding and consented to Telemedicine visit: Yes   Presenting Concerns: Patient and/or family reports the following symptoms/concerns: Mild increase in worry and  weepiness" postpartum, along with financial stress; good social support.  Duration of problem: Postpartum; Severity of problem: mild  Patient and/or Family's Strengths/Protective Factors: Social connections, Concrete supports in place (healthy food, safe environments, etc.), Sense of purpose, and Physical Health (exercise, healthy diet, medication compliance, etc.)  Goals Addressed: Patient will:  Maintain reduction on symptoms of: anxiety and depression   Increase  knowledge and/or ability of: healthy habits   Demonstrate ability to: Increase healthy adjustment to current life circumstances and Increase adequate support systems for patient/family  Progress towards Goals: Ongoing  Interventions: Interventions utilized:  Functional Assessment of ADLs, Psychoeducation and/or Health Education, Link to [employer], and Supportive Reflection Standardized Assessments completed:  Not needed today  Patient and/or Family Response: Sherry Campbell agrees with treatment plan  Assessment: Patient currently experiencing MDD, in remission; Psychosocial stress.   Patient may benefit from psychoeducation and brief therapeutic interventions regarding maintaining reduction of depressive and anxious symptoms.  Plan: Follow up with behavioral health clinician on : Two weeks Behavioral recommendations:  -Continue taking Prozac as prescribed  -Continue accepting practical support from family and friends -Prioritize healthy sleep when baby is sleeping the next two weeks  -Consider registering for new mom support group (on After Visit Summary) for additional support when fiance goes back to work -Accept referrals to [facility] and [employer] -Continue plan to establish with PCP of choice Referral(s): Integrated Art gallery manager (In Clinic) and [employer]:  Food and new mom support; childcare information  I discussed the assessment and treatment plan with the patient and/or parent/guardian. They were provided an opportunity to ask questions and all were answered. They agreed with the plan and demonstrated an understanding of the instructions.   They were advised to call back or seek an in-person evaluation if the symptoms worsen or if the condition fails to improve as anticipated.  Rae Lips, LCSW  Depression screen [facility] 2/9 01/04/2022 12/23/2021 12/13/2021 12/01/2021 11/14/2021  Decreased Interest 0 0 0 0 0  Down, Depressed, Hopeless 1 0 1 1 1   PHQ - 2  Score 1 0 1 1 1   Altered sleeping 0 1 1 0 0  Tired, decreased energy 0 1 1 1 1   Change in appetite  0 0 0 0 0  Feeling bad or failure about yourself  1 1 0 1 0  Trouble concentrating 0 0 0 0 0  Moving slowly or fidgety/restless 0 0 0 0 0  Suicidal thoughts 0 0 0 1 0  PHQ-9 Score 2 3 3 4 2    GAD 7 : Generalized Anxiety Score 01/04/2022 12/23/2021 12/13/2021 12/01/2021  Nervous, Anxious, on Edge 1 1 1 1   Control/stop worrying 1 1 1 1   Worry too much - different things 1 1 1 1   Trouble relaxing 0 0 0 0  Restless 0 0 0 0  Easily annoyed or irritable 0 0 0 0  Afraid - awful might happen 0 1 1 1   Total GAD 7 Score 3 4 4 4     Depression screen [facility] 2/9 01/04/2022 12/23/2021 12/13/2021 12/01/2021 11/14/2021  Decreased Interest 0 0 0 0 0  Down, Depressed, Hopeless 1 0 1 1 1   PHQ - 2 Score 1 0 1 1 1   Altered sleeping 0 1 1 0 0  Tired, decreased energy 0 1 1 1 1   Change in appetite 0 0 0 0 0  Feeling bad or failure about yourself  1 1 0 1 0  Trouble concentrating 0 0 0 0 0  Moving slowly or fidgety/restless 0 0 0 0 0  Suicidal thoughts 0 0 0 1 0  PHQ-9 Score 2 3 3 4  2

## 2022-01-26 ENCOUNTER — Other Ambulatory Visit: Payer: Self-pay | Admitting: Obstetrics & Gynecology

## 2022-01-26 DIAGNOSIS — O24113 Pre-existing diabetes mellitus, type 2, in pregnancy, third trimester: Secondary | ICD-10-CM

## 2022-01-29 ENCOUNTER — Other Ambulatory Visit: Payer: Self-pay | Admitting: Obstetrics & Gynecology

## 2022-01-29 DIAGNOSIS — O24113 Pre-existing diabetes mellitus, type 2, in pregnancy, third trimester: Secondary | ICD-10-CM

## 2022-01-30 ENCOUNTER — Ambulatory Visit (INDEPENDENT_AMBULATORY_CARE_PROVIDER_SITE_OTHER): Payer: Self-pay | Admitting: Clinical

## 2022-01-30 DIAGNOSIS — Z658 Other specified problems related to psychosocial circumstances: Secondary | ICD-10-CM

## 2022-01-30 DIAGNOSIS — F3341 Major depressive disorder, recurrent, in partial remission: Secondary | ICD-10-CM

## 2022-01-30 NOTE — Addendum Note (Signed)
Addended by: Hulda Marin C on: 01/30/2022 11:51 AM   Modules accepted: Orders

## 2022-01-30 NOTE — Patient Instructions (Signed)
[facility] at [facility] [address] [place], [place] [address] [phone] (main office) [phone] [facility] office)  New parent support groups:  [email or web address]  Www.[email or web address].net   Estate manager/land agent  (Childcare options, Early childcare development, etc.) [email or web address]

## 2022-01-30 NOTE — BH Specialist Note (Signed)
Integrated Behavioral Health via Telemedicine Visit ? ?02/13/2022 ?Sherry Campbell ?[id number] ? ?Number of [place] Clinician visits: 2- Second Visit ? ?Session Start time: 1356 ?  ?Session End time: [id number] ? ?Total time in minutes: 25 ? ? ?Referring Provider: Gaylan Gerold, CNM ?Patient/Family location: Home ?[facility] Provider location: Center for [employer] at [facility] ? ?All persons participating in visit: [name] and [name]  ? ?Types of Service: Individual psychotherapy and Telephone visit ? ?I connected with [name] and/or [name]  via  Telephone or Video Enabled Telemedicine Application  (Video is Caregility application) and verified that I am speaking with the correct person using two identifiers. Discussed confidentiality: Yes  ? ?I discussed the limitations of telemedicine and the availability of in person appointments.  Discussed there is a possibility of technology failure and discussed alternative modes of communication if that failure occurs. ? ?I discussed that engaging in this telemedicine visit, they consent to the provision of behavioral healthcare and the services will be billed under their insurance. ? ?Patient and/or legal guardian expressed understanding and consented to Telemedicine visit: Yes  ? ?Presenting Concerns: ?Patient and/or family reports the following symptoms/concerns: Anxious thoughts, fatigue; up to 4-5 hours of sleep now; taking BH medication as prescribed; open to implementing self-coping strategy to manage anxiety.  ?Duration of problem: Postpartum increase; Severity of problem: moderate ? ?Patient and/or Family's Strengths/Protective Factors: ?Social connections, Concrete supports in place (healthy food, safe environments, etc.), Sense of purpose, and Physical Health (exercise, healthy diet, medication compliance, etc.) ? ?Goals Addressed: ?Patient will: ? Reduce symptoms of: anxiety and  depression  ? Increase knowledge and/or ability of: coping skills  ? Demonstrate ability to: Increase healthy adjustment to current life circumstances ? ?Progress towards Goals: ?Ongoing ? ?Interventions: ?Interventions utilized:  Optician, dispensing ?Standardized Assessments completed: GAD-7 and PHQ 9 ? ?Patient and/or Family Response:Patient agrees with treatment plan. ? ? ?Assessment: ?Patient currently experiencing Major depressive disorder, recurrent, in partial remission and Psychosocial stress.  ? ?Patient may benefit from continued brief therapeutic interventions. ? ?Plan: ?Follow up with behavioral health clinician on : Two weeks ?Behavioral recommendations:  ?-Continue taking Prozac as prescribed ?-Continue prioritizing healthy self-care via healthy sleep and regular meals ?-CALM relaxation breathing exercise twice daily (morning; at bedtime with sleep sounds); as needed throughout the day. ? ?Referral(s): [place] (In Clinic) ? ?I discussed the assessment and treatment plan with the patient and/or parent/guardian. They were provided an opportunity to ask questions and all were answered. They agreed with the plan and demonstrated an understanding of the instructions. ?  ?They were advised to call back or seek an in-person evaluation if the symptoms worsen or if the condition fails to improve as anticipated. ? ?Garlan Fair, LCSW ?Depression screen [facility] 2/9 02/13/2022 01/04/2022 12/23/2021 12/13/2021 12/01/2021  ?Decreased Interest 0 0 0 0 0  ?Down, Depressed, Hopeless 1 1 0 1 1  ?PHQ - 2 Score 1 1 0 1 1  ?Altered sleeping 0 0 1 1 0  ?Tired, decreased energy 1 0 1 1 1   ?Change in appetite 0 0 0 0 0  ?Feeling bad or failure about yourself  0 1 1 0 1  ?Trouble concentrating 1 0 0 0 0  ?Moving slowly or fidgety/restless 0 0 0 0 0  ?Suicidal thoughts 0 0 0 0 1  ?PHQ-9 Score 3 2 3 3 4   ? ?GAD 7 : Generalized  Anxiety Score 02/13/2022 01/04/2022 12/23/2021 12/13/2021  ?Nervous,  Anxious, on Edge 2 1 1 1   ?Control/stop worrying 1 1 1 1   ?Worry too much - different things 1 1 1 1   ?Trouble relaxing 0 0 0 0  ?Restless 1 0 0 0  ?Easily annoyed or irritable 1 0 0 0  ?Afraid - awful might happen 1 0 1 1  ?Total GAD 7 Score 7 3 4 4   ? ? ? ? ?

## 2022-02-01 ENCOUNTER — Other Ambulatory Visit: Payer: Self-pay | Admitting: Obstetrics & Gynecology

## 2022-02-01 DIAGNOSIS — O24113 Pre-existing diabetes mellitus, type 2, in pregnancy, third trimester: Secondary | ICD-10-CM

## 2022-02-13 ENCOUNTER — Ambulatory Visit (INDEPENDENT_AMBULATORY_CARE_PROVIDER_SITE_OTHER): Payer: 59 | Admitting: Clinical

## 2022-02-13 DIAGNOSIS — F3341 Major depressive disorder, recurrent, in partial remission: Secondary | ICD-10-CM

## 2022-02-13 DIAGNOSIS — Z658 Other specified problems related to psychosocial circumstances: Secondary | ICD-10-CM

## 2022-02-15 NOTE — BH Specialist Note (Signed)
Integrated Behavioral Health via Telemedicine Visit ? ?02/28/2022 ?[name] ?[id number] ? ?Number of Integrated Behavioral Health Clinician visits: 3- Third Visit ? ?Session Start time: 1357 ?  ?Session End time: 1415 ? ?Total time in minutes: 18 ? ? ?Referring Provider: Edd Arbour, CNM ?Patient/Family location: Home ?[facility] Provider location: Center for [employer] at [facility] ? ?All persons participating in visit: [name] and [facility] Jillian Warth  ? ?Types of Service: Individual psychotherapy and Telephone visit ? ?I connected with Delrae Alfred and/or Annitta Needs  n/a  via  Telephone or Video Enabled Telemedicine Application  (Video is Caregility application) and verified that I am speaking with the correct person using two identifiers. Discussed confidentiality: Yes  ? ?I discussed the limitations of telemedicine and the availability of in person appointments.  Discussed there is a possibility of technology failure and discussed alternative modes of communication if that failure occurs. ? ?I discussed that engaging in this telemedicine visit, they consent to the provision of behavioral healthcare and the services will be billed under their insurance. ? ?Patient and/or legal guardian expressed understanding and consented to Telemedicine visit: Yes  ? ?Presenting Concerns: ?Patient and/or family reports the following symptoms/concerns: Self-doubt (conflicting feelings regarding holding baby vs letting her cry) mental exhaustion, and feeling lonely at home all day. ?Duration of problem: Postpartum; Severity of problem: moderate ? ?Patient and/or Family's Strengths/Protective Factors: ?Social connections, Concrete supports in place (healthy food, safe environments, etc.), Sense of purpose, and Physical Health (exercise, healthy diet, medication compliance, etc.) ? ?Goals Addressed: ?Patient will: ? Reduce symptoms of: anxiety and depression  ? Increase knowledge  and/or ability of: stress reduction  ? Demonstrate ability to: Increase healthy adjustment to current life circumstances and Increase adequate support systems for patient/family ? ?Progress towards Goals: ?Ongoing ? ?Interventions: ?Interventions utilized:  Solution-Focused Strategies and Supportive Reflection ?Standardized Assessments completed:  PHQ9/GAD7 given in past two weeks ? ?Patient and/or Family Response: Patient agrees with treatment plan. ? ? ?Assessment: ?Patient currently experiencing Major depressive disorder, recurrent, in partial remission and Psychosocial stress.  ? ?Patient may benefit from brief therapeutic interventions today. ? ?Plan: ?Follow up with behavioral health clinician on : Call Christianne Zacher at [phone], as needed. ?Behavioral recommendations:  ?-Continue taking Prozac as prescribed ?-Continue relaxation breathing exercises as needed throughout the day ?-Register and attend at least one new mom support group of choice at [email or web address] and/or [email or web address] ?-Consider spending time outdoors daily ?Referral(s): Integrated Art gallery manager (In Clinic) and [employer]:  new mom support ? ?I discussed the assessment and treatment plan with the patient and/or parent/guardian. They were provided an opportunity to ask questions and all were answered. They agreed with the plan and demonstrated an understanding of the instructions. ?  ?They were advised to call back or seek an in-person evaluation if the symptoms worsen or if the condition fails to improve as anticipated. ? ?Rae Lips, LCSW ? ? ?  02/13/2022  ?  2:01 PM 01/04/2022  ?  5:04 PM 12/23/2021  ? 10:48 AM 12/13/2021  ?  3:48 PM 12/01/2021  ?  8:37 AM  ?Depression screen PHQ 2/9  ?Decreased Interest 0 0 0 0 0  ?Down, Depressed, Hopeless 1 1 0 1 1  ?PHQ - 2 Score 1 1 0 1 1  ?Altered sleeping 0 0 1 1 0  ?Tired, decreased energy 1 0 1 1 1   ?Change in appetite 0 0 0 0 0  ?Feeling bad or failure  about yourself   0 1 1 0 1  ?Trouble concentrating 1 0 0 0 0  ?Moving slowly or fidgety/restless 0 0 0 0 0  ?Suicidal thoughts 0 0 0 0 1  ?PHQ-9 Score 3 2 3 3 4   ? ? ?  02/13/2022  ?  2:02 PM 01/04/2022  ?  5:05 PM 12/23/2021  ? 10:48 AM 12/13/2021  ?  3:48 PM  ?GAD 7 : Generalized Anxiety Score  ?Nervous, Anxious, on Edge 2 1 1 1   ?Control/stop worrying 1 1 1 1   ?Worry too much - different things 1 1 1 1   ?Trouble relaxing 0 0 0 0  ?Restless 1 0 0 0  ?Easily annoyed or irritable 1 0 0 0  ?Afraid - awful might happen 1 0 1 1  ?Total GAD 7 Score 7 3 4 4   ? ?

## 2022-02-17 ENCOUNTER — Other Ambulatory Visit: Payer: 59

## 2022-02-17 ENCOUNTER — Other Ambulatory Visit: Payer: Self-pay

## 2022-02-17 ENCOUNTER — Encounter: Payer: Self-pay | Admitting: Family Medicine

## 2022-02-17 ENCOUNTER — Ambulatory Visit (INDEPENDENT_AMBULATORY_CARE_PROVIDER_SITE_OTHER): Payer: 59 | Admitting: Family Medicine

## 2022-02-17 DIAGNOSIS — T8332XA Displacement of intrauterine contraceptive device, initial encounter: Secondary | ICD-10-CM

## 2022-02-17 DIAGNOSIS — Z975 Presence of (intrauterine) contraceptive device: Secondary | ICD-10-CM | POA: Insufficient documentation

## 2022-02-17 DIAGNOSIS — F32A Depression, unspecified: Secondary | ICD-10-CM | POA: Diagnosis not present

## 2022-02-17 DIAGNOSIS — E119 Type 2 diabetes mellitus without complications: Secondary | ICD-10-CM

## 2022-02-17 DIAGNOSIS — Z8759 Personal history of other complications of pregnancy, childbirth and the puerperium: Secondary | ICD-10-CM | POA: Diagnosis not present

## 2022-02-17 NOTE — Progress Notes (Signed)
? ? ?Post Partum Visit Note ? ?Sherry Campbell is a 36 y.o. G30P1001 female who presents for a postpartum visit. She is 4 weeks postpartum following a normal spontaneous vaginal delivery.  I have fully reviewed the prenatal and intrapartum course. The delivery was at 37/1 gestational weeks.  Anesthesia: IV sedation. Postpartum course has been uneventful. Baby is doing well. Baby is feeding by both breast and bottle - [place] Good Start. Bleeding no bleeding. Bowel function is normal. Bladder function is normal. Patient is not sexually active. Contraception method is IUD. Postpartum depression screening: negative. ? ? ?The pregnancy intention screening data noted above was reviewed. Potential methods of contraception were discussed. The patient elected to proceed with No data recorded. ? ? Edinburgh Postnatal Depression Scale - 02/17/22 0849   ? ?  ? Edinburgh Postnatal Depression Scale:  In the Past 7 Days  ? I have been able to laugh and see the funny side of things. 0   ? I have looked forward with enjoyment to things. 0   ? I have blamed myself unnecessarily when things went wrong. 0   ? I have been anxious or worried for no good reason. 2   ? I have felt scared or panicky for no good reason. 1   ? Things have been getting on top of me. 0   ? I have been so unhappy that I have had difficulty sleeping. 0   ? I have felt sad or miserable. 0   ? I have been so unhappy that I have been crying. 0   ? The thought of harming myself has occurred to me. 0   ? Edinburgh Postnatal Depression Scale Total 3   ? ?  ?  ? ?  ? ? ?Health Maintenance Due  ?Topic Date Due  ? FOOT EXAM  Never done  ? OPHTHALMOLOGY EXAM  Never done  ? URINE MICROALBUMIN  Never done  ? COVID-19 Vaccine (4 - Booster for Pfizer series) 03/15/2021  ? INFLUENZA VACCINE  07/04/2021  ? ? ?The following portions of the patient's history were reviewed and updated as appropriate: allergies, current medications, past family history, past medical history, past  social history, past surgical history, and problem list. ? ?Review of Systems ?Pertinent items noted in HPI and remainder of comprehensive ROS otherwise negative. ? ?Objective:  ?BP 112/70   Pulse 77   Ht 5\' 2"  (1.575 m)   Wt 176 lb (79.8 kg)   LMP 05/05/2021   Breastfeeding Yes Comment: On & Off, mostly at night  BMI 32.19 kg/m?   ? ?General:  alert, cooperative, and appears stated age  ? Breasts:  not indicated  ?Lungs: Comfortable on room air  ?Wound N/a  ?GU exam:   Normal introitus, some suture noted which was removed with pickups and scissors. Tip of IUD noted to be protruding from cervical os, removed without difficulty. Cervix and vagina normal in appearance.   ?     ?Assessment:  ? ? There are no diagnoses linked to this encounter. ? ?Normal postpartum exam.  ? ?Plan:  ? ?Essential components of care per ACOG recommendations: ? ?1.  Mood and well being: Patient with negative depression screening today. Reviewed local resources for support.  ?- Patient tobacco use? No.   ?- hx of drug use? No.   ? ?2. Infant care and feeding:  ?-Patient currently breastmilk feeding? Yes. Reviewed importance of draining breast regularly to support lactation.  Very interested in lactation consult, referral  placed.  ?-Social determinants of health (SDOH) reviewed in EPIC. No concerns ? ?3. Sexuality, contraception and birth spacing ?- Patient does not want a pregnancy in the next year.  Desired family size is unsure.  ?- Reviewed reproductive life planning. Reviewed contraceptive methods based on pt preferences and effectiveness.  Patient desired IUD, placed post placentally and strings noted to be displaced. After verbal consent IUD was removed, plan to return in 2-3 weeks for IUD re-insertion.   ?- Discussed birth spacing of 18 months ? ?4. Sleep and fatigue ?-Encouraged family/partner/community support of 4 hrs of uninterrupted sleep to help with mood and fatigue ? ?5. Physical Recovery  ?- Discussed patients delivery  and complications. She describes her labor as good. ?- Patient had a Vaginal problems after delivery including shoulder dystocia and  . Patient had a 1st degree laceration. Perineal healing reviewed. Patient expressed understanding ?- Patient has urinary incontinence? No. ?- Patient is safe to resume physical and sexual activity ? ?6.  Health Maintenance ?- HM due items addressed Yes ?- Last pap smear No results found for: DIAGPAP Pap smear not done at today's visit.  ?-Breast Cancer screening indicated? No.  ? ?7. Chronic Disease/Pregnancy Condition follow up: Gestational Diabetes ?- PCP follow up ? ?Venora Maples, MD ?Center for [facility], [facility]  ?

## 2022-02-21 NOTE — BH Specialist Note (Deleted)
Integrated Behavioral Health via Telemedicine Visit ? ?02/21/2022 ?Zakhia Burtnett ?[id number] ? ?Number of [place] Clinician visits: 2- Second Visit ? ?Session Start time: 1356 ?  ?Session End time: [id number] ? ?Total time in minutes: 25 ? ? ?Referring Provider: *** ?Patient/Family location: *** ?[facility] Provider location: *** ?All persons participating in visit: *** ?Types of Service: {CHL AMB TYPE OF SERVICE:2082174879} ? ?I connected with Sherry Campbell and/or Sherry Campbell's {family members:20773} via  Telephone or Geologist, engineering  (Video is Caregility application) and verified that I am speaking with the correct person using two identifiers. Discussed confidentiality: {YES/NO:21197} ? ?I discussed the limitations of telemedicine and the availability of in person appointments.  Discussed there is a possibility of technology failure and discussed alternative modes of communication if that failure occurs. ? ?I discussed that engaging in this telemedicine visit, they consent to the provision of behavioral healthcare and the services will be billed under their insurance. ? ?Patient and/or legal guardian expressed understanding and consented to Telemedicine visit: {YES/NO:21197} ? ?Presenting Concerns: ?Patient and/or family reports the following symptoms/concerns: *** ?Duration of problem: ***; Severity of problem: {Mild/Moderate/Severe:20260} ? ?Patient and/or Family's Strengths/Protective Factors: ?{CHL AMB BH PROTECTIVE FACTORS:[phone]} ? ?Goals Addressed: ?Patient will: ? Reduce symptoms of: {IBH Symptoms:21014056}  ? Increase knowledge and/or ability of: {IBH Patient Tools:21014057}  ? Demonstrate ability to: {IBH Goals:21014053} ? ?Progress towards Goals: ?{CHL AMB BH PROGRESS TOWARDS [place]:7150587 ? ?Interventions: ?Interventions utilized:  {IBH Interventions:21014054} ?Standardized Assessments completed: {IBH Screening Tools:21014051} ? ?Patient and/or Family  Response: *** ? ?Assessment: ?Patient currently experiencing ***.  ? ?Patient may benefit from ***. ? ?Plan: ?Follow up with behavioral health clinician on : *** ?Behavioral recommendations: *** ?Referral(s): {IBH Referrals:21014055} ? ?I discussed the assessment and treatment plan with the patient and/or parent/guardian. They were provided an opportunity to ask questions and all were answered. They agreed with the plan and demonstrated an understanding of the instructions. ?  ?They were advised to call back or seek an in-person evaluation if the symptoms worsen or if the condition fails to improve as anticipated. ? ?Garlan Fair, LCSW ?

## 2022-02-28 ENCOUNTER — Ambulatory Visit (INDEPENDENT_AMBULATORY_CARE_PROVIDER_SITE_OTHER): Payer: 59 | Admitting: Clinical

## 2022-02-28 ENCOUNTER — Other Ambulatory Visit: Payer: Self-pay

## 2022-02-28 DIAGNOSIS — Z658 Other specified problems related to psychosocial circumstances: Secondary | ICD-10-CM

## 2022-02-28 DIAGNOSIS — F3341 Major depressive disorder, recurrent, in partial remission: Secondary | ICD-10-CM

## 2022-02-28 NOTE — Patient Instructions (Signed)
[facility] at [facility] °[address] °, [place] [address] °[phone] (main office) °[phone] (Delainey Winstanley's office) ° ° °New mom support groups:  °[email or web address] °[email or web address] °

## 2022-03-14 ENCOUNTER — Encounter: Payer: Self-pay | Admitting: Family Medicine

## 2022-03-14 ENCOUNTER — Ambulatory Visit (INDEPENDENT_AMBULATORY_CARE_PROVIDER_SITE_OTHER): Payer: 59 | Admitting: Family Medicine

## 2022-03-14 VITALS — BP 111/55 | HR 65 | Wt 185.9 lb

## 2022-03-14 DIAGNOSIS — Z975 Presence of (intrauterine) contraceptive device: Secondary | ICD-10-CM

## 2022-03-14 LAB — POCT PREGNANCY, URINE: Preg Test, Ur: NEGATIVE

## 2022-03-14 MED ORDER — LEVONORGESTREL 20.1 MCG/DAY IU IUD
1.0000 | INTRAUTERINE_SYSTEM | Freq: Once | INTRAUTERINE | Status: AC
  Administered 2022-03-14: 1 via INTRAUTERINE

## 2022-03-14 NOTE — Addendum Note (Signed)
Addended by: Denyce Robert E on: 03/14/2022 10:05 AM ? ? Modules accepted: Orders ? ?

## 2022-03-14 NOTE — Progress Notes (Signed)
? ? ?  GYNECOLOGY OFFICE PROCEDURE NOTE ? ?Sherry Campbell is a 36 y.o. G1P1001 here for Liletta IUD insertion. No GYN concerns.  Last pap smear: ? ?04/26/2021 - NILM, neg HPV ? ?Urine pregnancy test: negative ? ?IUD Insertion Procedure Note ?Patient identified, informed consent performed, consent signed.   Discussed risks of irregular bleeding, increased cramping, infection, malpositioning or misplacement of the IUD outside the uterus which may require further procedure such as laparoscopy. Also discussed >99% contraception efficacy, increased risk of ectopic pregnancy with failure of method.  Time out was performed. ? ?Speculum placed in the vagina.  Cervix visualized.  Cleaned with Betadine x 2.  Grasped anteriorly with a single tooth tenaculum.  Uterus sounded to 7 cm. IUD placed per manufacturer's recommendations.  Strings trimmed to 3 cm. Tenaculum was removed, good hemostasis noted.  Patient tolerated procedure well.  ? ?Patient was given post-procedure instructions.  She was advised to have backup contraception for one week.  Patient was also asked to check IUD strings periodically and follow up in 4 weeks for IUD check. ? ?Venora Maples, MD/MPH ?Attending Family Medicine Physician, Faculty Practice ?Center for [employer], [facility] ? ?

## 2022-04-11 ENCOUNTER — Ambulatory Visit (INDEPENDENT_AMBULATORY_CARE_PROVIDER_SITE_OTHER): Payer: 59 | Admitting: Family Medicine

## 2022-04-11 ENCOUNTER — Encounter: Payer: Self-pay | Admitting: Family Medicine

## 2022-04-11 VITALS — BP 118/75 | HR 65 | Wt 185.0 lb

## 2022-04-11 DIAGNOSIS — Z975 Presence of (intrauterine) contraceptive device: Secondary | ICD-10-CM

## 2022-04-11 NOTE — Assessment & Plan Note (Signed)
Liletta IUD well positioned, discussed we could trim strings to cervical os but this may make removal later on difficult, she defers at this time. Follow up PRN for removal if she is dissatisfied with the method or if she desires another pregnancy.  ?

## 2022-04-11 NOTE — Progress Notes (Signed)
? ?  GYNECOLOGY OFFICE VISIT NOTE ? ?History:  ? Sherry Campbell is a 36 y.o. G1P1001 here today for IUD string check. ? ?Liletta IUD placed on 03/14/2022, strings trimmed to 3 cm ?Today reports very happy with IUD ?Spotting for two weeks and then a light period and none since ?Partner reports he can feel the strings with some positions but not painful, just noticeable ? ?Health Maintenance Due  ?Topic Date Due  ? FOOT EXAM  Never done  ? OPHTHALMOLOGY EXAM  Never done  ? URINE MICROALBUMIN  Never done  ? COVID-19 Vaccine (4 - Booster for Pfizer series) 03/15/2021  ? ? ?Past Medical History:  ?Diagnosis Date  ? Anxiety   ? Anxiety   ? Depression   ? Depression   ? Diabetes mellitus without complication (HCC)   ? 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 Dysplasia of cervix, low grade (CIN 1)   ? Syncope   ? saw a cardiologist resolved without intervention  ? UTI (urinary tract infection)   ? ? ?Past Surgical History:  ?Procedure Laterality Date  ? WISDOM TOOTH EXTRACTION    ? ? ?The following portions of the patient's history were reviewed and updated as appropriate: allergies, current medications, past family history, past medical history, past social history, past surgical history and problem list.  ? ?Health Maintenance:   ?Last pap: ?No results found for: DIAGPAP, HPV, HPVHIGH ?04/26/2021 - NILM, neg HPV ? ?Last mammogram:  ?N/a  ? ? ?Review of Systems:  ?Pertinent items noted in HPI and remainder of comprehensive ROS otherwise negative. ? ?Physical Exam:  ?BP 118/75   Pulse 65   Wt 185 lb (83.9 kg)   LMP  (LMP Unknown)   Breastfeeding Yes   BMI 33.84 kg/m?  ?CONSTITUTIONAL: Well-developed, well-nourished female in no acute distress.  ?HEENT:  Normocephalic, atraumatic. External right and left ear normal. No scleral icterus.  ?NECK: Normal range of motion, supple, no masses noted on observation ?SKIN: No rash noted. Not diaphoretic. No erythema. No pallor. ?MUSCULOSKELETAL: Normal range of motion. No edema noted. ?NEUROLOGIC:  Alert and oriented to person, place, and time. Normal muscle tone coordination.  ?PSYCHIATRIC: Normal mood and affect. Normal behavior. Normal judgment and thought content. ?RESPIRATORY: Effort normal, no problems with respiration noted ?ABDOMEN: No masses noted. No other overt distention noted.   ?PELVIC: Normal appearing external genitalia; normal appearing vaginal mucosa and cervix.  No abnormal discharge noted. Strings 3 cm in length as previously documented ? ?Labs and Imaging ?No results found for this or any previous visit (from the past 168 hour(s)). ?No results found.    ?Assessment and Plan:  ? ?Problem List Items Addressed This Visit   ? ?  ? Other  ? IUD (intrauterine device) in place - Primary  ?  Liletta IUD well positioned, discussed we could trim strings to cervical os but this may make removal later on difficult, she defers at this time. Follow up PRN for removal if she is dissatisfied with the method or if she desires another pregnancy.  ? ?  ?  ? ? ?Routine preventative health maintenance measures emphasized. ?Please refer to After Visit Summary for other counseling recommendations.  ? ?Return as needed. ?  ?  ? ?Total face-to-face time with patient: 10 minutes.  Over 50% of encounter was spent on counseling and coordination of care. ? ? ?Venora Maples, MD/MPH ?Attending Family Medicine Physician, Faculty Practice ?Center for [employer], [facility] ? ?

## 2022-05-22 ENCOUNTER — Other Ambulatory Visit: Payer: Self-pay | Admitting: Obstetrics & Gynecology

## 2022-05-22 DIAGNOSIS — O24113 Pre-existing diabetes mellitus, type 2, in pregnancy, third trimester: Secondary | ICD-10-CM

## 2022-10-03 ENCOUNTER — Other Ambulatory Visit (HOSPITAL_COMMUNITY): Payer: Self-pay

## 2022-10-03 MED ORDER — METFORMIN HCL ER 500 MG PO TB24
1000.0000 mg | ORAL_TABLET | Freq: Two times a day (BID) | ORAL | 0 refills | Status: DC
  Filled 2022-10-03: qty 360, 90d supply, fill #0

## 2022-10-03 MED ORDER — GABAPENTIN 300 MG PO CAPS
300.0000 mg | ORAL_CAPSULE | Freq: Every day | ORAL | 0 refills | Status: DC
  Filled 2022-10-03: qty 30, 30d supply, fill #0

## 2022-10-03 MED ORDER — FLUOXETINE HCL 40 MG PO CAPS
40.0000 mg | ORAL_CAPSULE | Freq: Every morning | ORAL | 0 refills | Status: DC
  Filled 2022-10-03 – 2023-01-11 (×4): qty 90, 90d supply, fill #0

## 2022-10-03 MED ORDER — FLUOXETINE HCL 40 MG PO CAPS
40.0000 mg | ORAL_CAPSULE | ORAL | 0 refills | Status: DC
  Filled 2022-10-03: qty 90, 90d supply, fill #0

## 2022-10-03 MED ORDER — GABAPENTIN 300 MG PO CAPS
300.0000 mg | ORAL_CAPSULE | Freq: Every day | ORAL | 0 refills | Status: AC
  Filled 2022-10-03 – 2023-02-05 (×3): qty 30, 30d supply, fill #0

## 2022-10-06 ENCOUNTER — Other Ambulatory Visit (HOSPITAL_COMMUNITY): Payer: Self-pay

## 2022-10-06 MED ORDER — VITAMIN D (ERGOCALCIFEROL) 50000 UNITS PO CAPS
ORAL_CAPSULE | ORAL | 0 refills | Status: DC
  Filled 2022-10-06: qty 12, 84d supply, fill #0

## 2022-11-02 ENCOUNTER — Other Ambulatory Visit (HOSPITAL_COMMUNITY): Payer: Self-pay

## 2022-11-02 MED ORDER — OZEMPIC (0.25 OR 0.5 MG/DOSE) 2 MG/3ML ~~LOC~~ SOPN
0.2500 mg | PEN_INJECTOR | SUBCUTANEOUS | 0 refills | Status: DC
  Filled 2022-11-02: qty 1.5, 56d supply, fill #0
  Filled 2022-11-06: qty 3, 30d supply, fill #0

## 2022-11-02 MED ORDER — AMPHETAMINE-DEXTROAMPHETAMINE 10 MG PO TABS
10.0000 mg | ORAL_TABLET | Freq: Every day | ORAL | 0 refills | Status: DC
  Filled 2022-11-02: qty 30, 30d supply, fill #0

## 2022-11-06 ENCOUNTER — Other Ambulatory Visit (HOSPITAL_COMMUNITY): Payer: Self-pay

## 2022-11-23 ENCOUNTER — Ambulatory Visit: Payer: Self-pay | Admitting: Dietician

## 2022-11-29 ENCOUNTER — Other Ambulatory Visit (HOSPITAL_COMMUNITY): Payer: Self-pay

## 2022-11-29 ENCOUNTER — Other Ambulatory Visit: Payer: Self-pay

## 2022-11-29 MED ORDER — OZEMPIC (0.25 OR 0.5 MG/DOSE) 2 MG/3ML ~~LOC~~ SOPN
0.5000 mg | PEN_INJECTOR | SUBCUTANEOUS | 0 refills | Status: DC
  Filled 2022-11-29 – 2022-12-01 (×2): qty 3, 28d supply, fill #0

## 2022-11-29 MED ORDER — NYSTATIN 100000 UNIT/GM EX POWD
CUTANEOUS | 2 refills | Status: DC
  Filled 2022-11-29: qty 30, 10d supply, fill #0

## 2022-11-29 MED ORDER — AMPHETAMINE-DEXTROAMPHETAMINE 10 MG PO TABS
10.0000 mg | ORAL_TABLET | Freq: Every day | ORAL | 0 refills | Status: DC
  Filled 2022-11-29: qty 30, 30d supply, fill #0

## 2022-11-29 MED ORDER — GABAPENTIN 300 MG PO CAPS
300.0000 mg | ORAL_CAPSULE | Freq: Every day | ORAL | 0 refills | Status: DC
  Filled 2022-11-29 (×2): qty 90, 90d supply, fill #0

## 2022-11-30 ENCOUNTER — Other Ambulatory Visit (HOSPITAL_COMMUNITY): Payer: Self-pay

## 2022-11-30 ENCOUNTER — Other Ambulatory Visit: Payer: Self-pay

## 2022-12-01 ENCOUNTER — Other Ambulatory Visit (HOSPITAL_COMMUNITY): Payer: Self-pay

## 2022-12-05 ENCOUNTER — Other Ambulatory Visit: Payer: Self-pay

## 2022-12-06 ENCOUNTER — Other Ambulatory Visit (HOSPITAL_COMMUNITY): Payer: Self-pay

## 2022-12-13 ENCOUNTER — Other Ambulatory Visit (HOSPITAL_COMMUNITY): Payer: Self-pay

## 2023-01-11 ENCOUNTER — Other Ambulatory Visit: Payer: Self-pay

## 2023-01-11 ENCOUNTER — Other Ambulatory Visit (HOSPITAL_COMMUNITY): Payer: Self-pay

## 2023-01-11 DIAGNOSIS — R000 Tachycardia, unspecified: Secondary | ICD-10-CM | POA: Diagnosis not present

## 2023-01-11 DIAGNOSIS — E559 Vitamin D deficiency, unspecified: Secondary | ICD-10-CM | POA: Diagnosis not present

## 2023-01-11 DIAGNOSIS — F909 Attention-deficit hyperactivity disorder, unspecified type: Secondary | ICD-10-CM | POA: Diagnosis not present

## 2023-01-11 DIAGNOSIS — R030 Elevated blood-pressure reading, without diagnosis of hypertension: Secondary | ICD-10-CM | POA: Diagnosis not present

## 2023-01-11 DIAGNOSIS — F331 Major depressive disorder, recurrent, moderate: Secondary | ICD-10-CM | POA: Diagnosis not present

## 2023-01-11 DIAGNOSIS — B3731 Acute candidiasis of vulva and vagina: Secondary | ICD-10-CM | POA: Diagnosis not present

## 2023-01-11 DIAGNOSIS — E1165 Type 2 diabetes mellitus with hyperglycemia: Secondary | ICD-10-CM | POA: Diagnosis not present

## 2023-01-11 DIAGNOSIS — E669 Obesity, unspecified: Secondary | ICD-10-CM | POA: Diagnosis not present

## 2023-01-11 DIAGNOSIS — E1142 Type 2 diabetes mellitus with diabetic polyneuropathy: Secondary | ICD-10-CM | POA: Diagnosis not present

## 2023-01-11 DIAGNOSIS — F410 Panic disorder [episodic paroxysmal anxiety] without agoraphobia: Secondary | ICD-10-CM | POA: Diagnosis not present

## 2023-01-11 MED ORDER — AMPHETAMINE-DEXTROAMPHETAMINE 10 MG PO TABS
10.0000 mg | ORAL_TABLET | Freq: Every day | ORAL | 0 refills | Status: DC
  Filled 2023-01-11: qty 30, 30d supply, fill #0

## 2023-01-11 MED ORDER — FLUOXETINE HCL 40 MG PO CAPS
40.0000 mg | ORAL_CAPSULE | Freq: Every morning | ORAL | 0 refills | Status: DC
  Filled 2023-01-11: qty 90, 90d supply, fill #0

## 2023-01-11 MED ORDER — OZEMPIC (1 MG/DOSE) 4 MG/3ML ~~LOC~~ SOPN
1.0000 mg | PEN_INJECTOR | SUBCUTANEOUS | 0 refills | Status: DC
  Filled 2023-01-11 – 2023-02-02 (×2): qty 3, 28d supply, fill #0

## 2023-01-17 IMAGING — US US MFM OB FOLLOW-UP
1 series · 14 of 28 positions shown · non-contrast
Comparison: none

[Series 1: us mfm ob follow-up · 52 acquisitions, 14 frames shown]
[im 2/52]
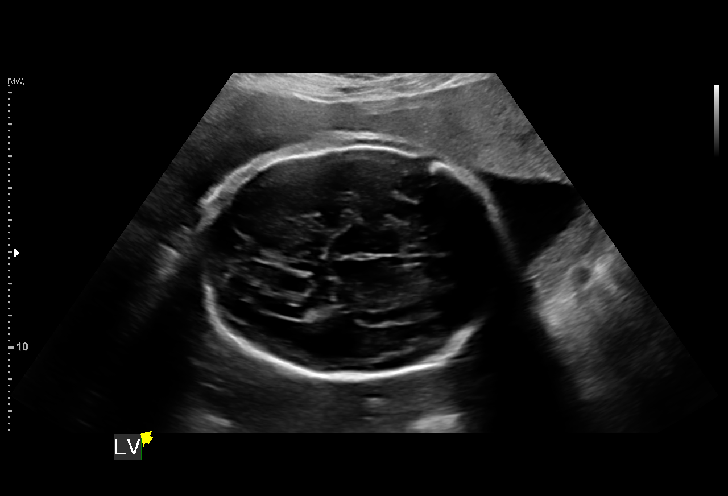
[im 6/52]
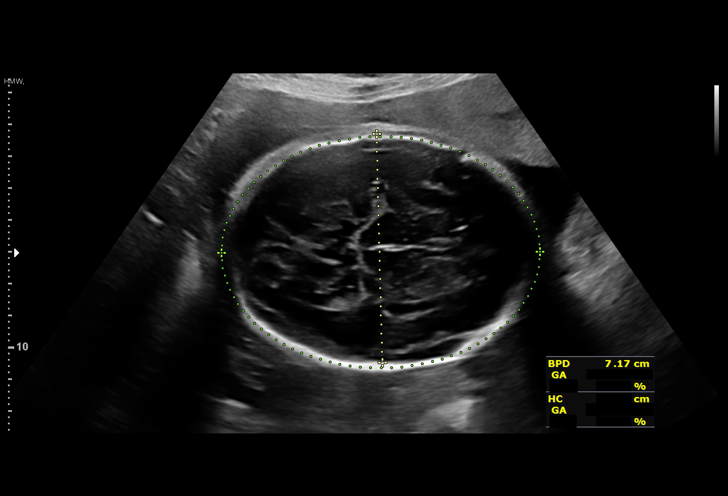
[im 10/52]
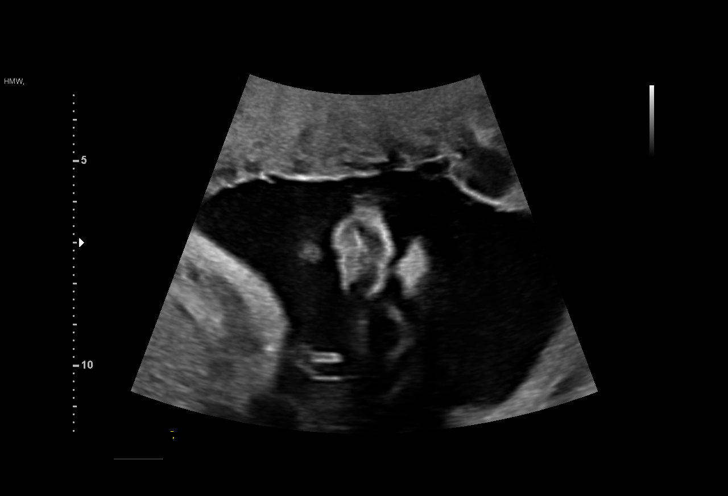
[im 14/52]
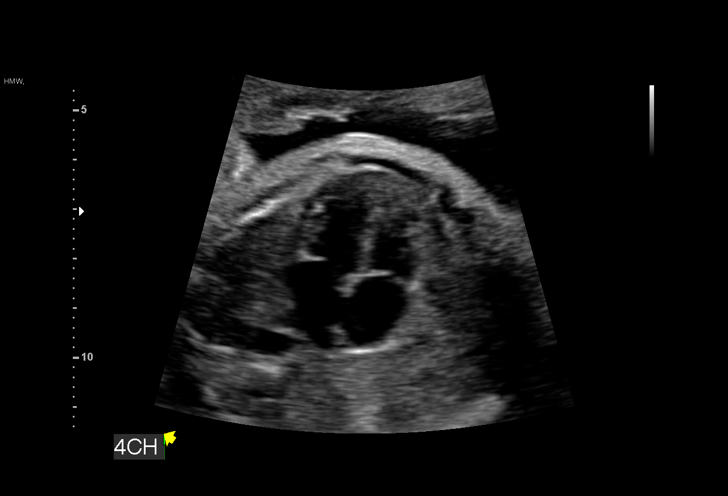
[im 18/52]
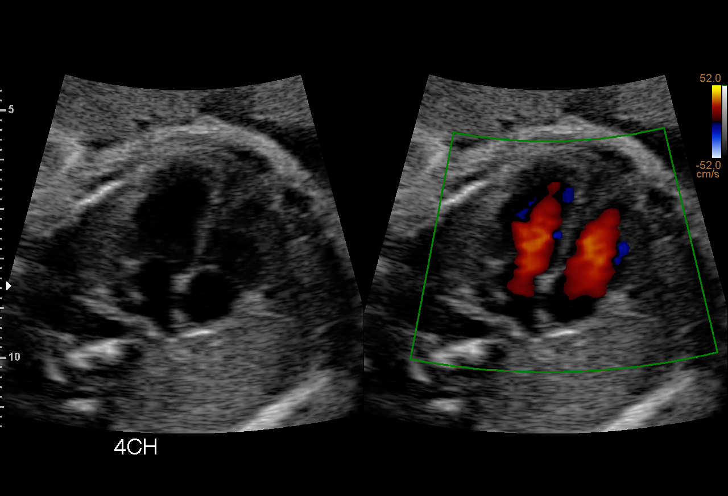
[im 21/52]
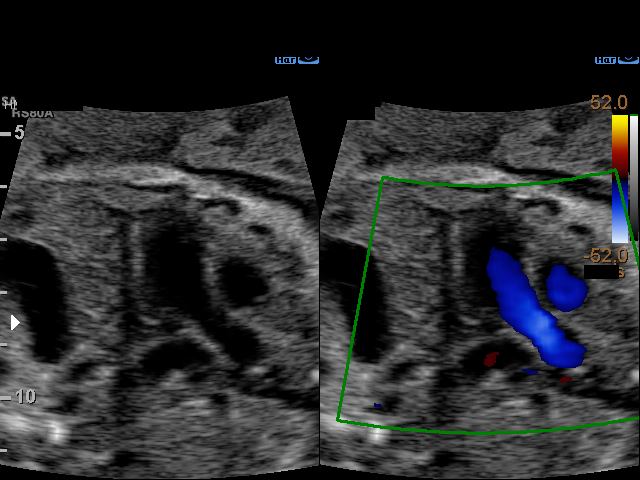
[im 25/52]
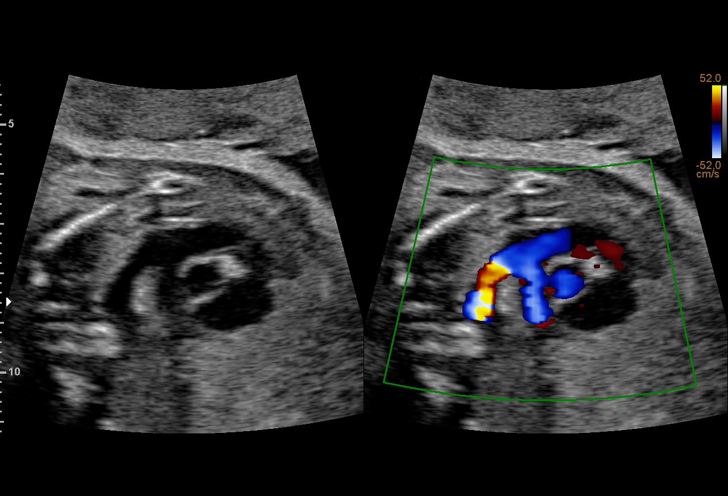
[im 29/52]
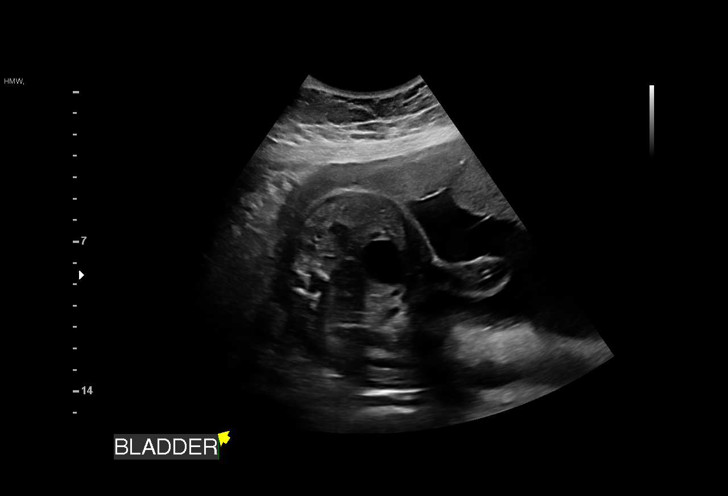
[im 33/52]
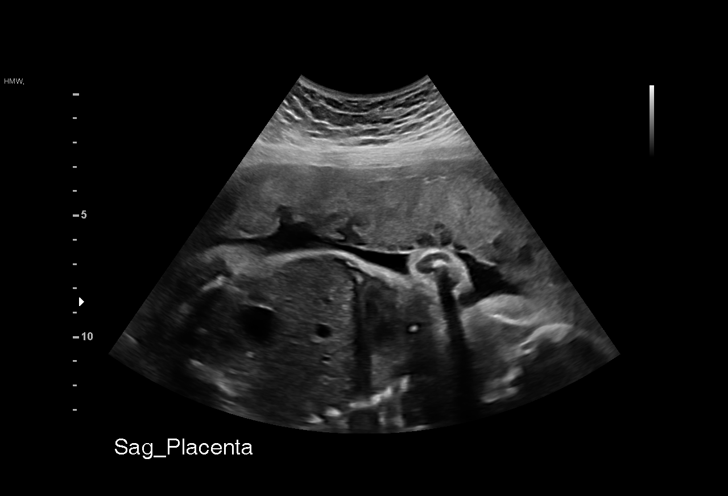
[im 36/52]
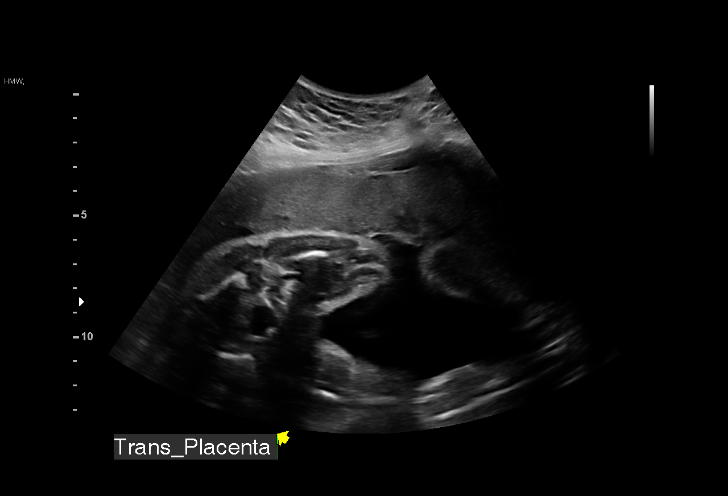
[im 40/52]
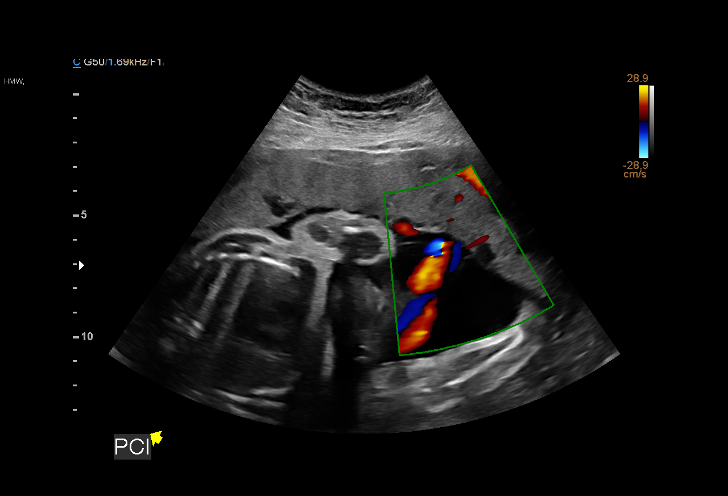
[im 44/52]
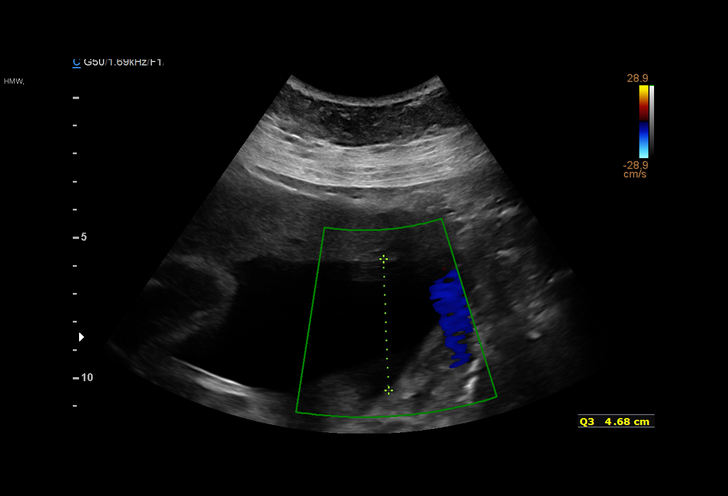
[im 48/52]
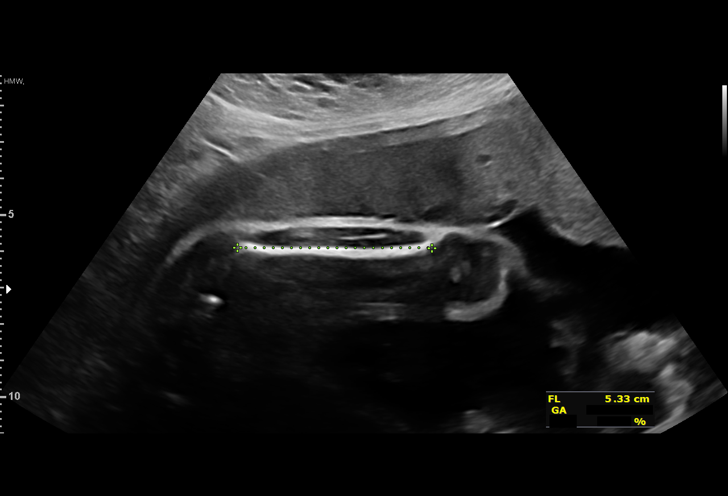
[im 52/52]
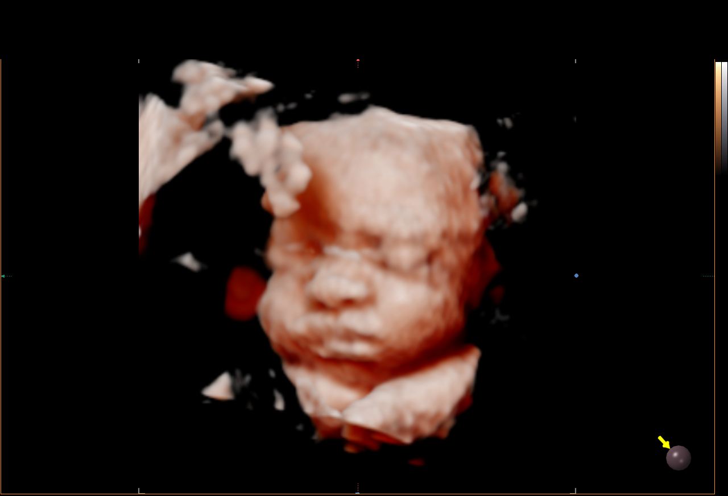

[14 of 28 positions shown; findings below may reference images not displayed]

[REDACTED]care
                   WALKER CNM

Indications

 Pre-existing diabetes, type 2, in pregnancy,
 second trimester
 28 weeks gestation of pregnancy
 No screening (NT Normal)
 Encounter for other antenatal screening
 follow-up
Fetal Evaluation

 Num Of Fetuses:         1
 Fetal Heart Rate(bpm):  118
 Cardiac Activity:       Observed
 Presentation:           Cephalic
 Placenta:               Anterior
 P. Cord Insertion:      Visualized, central

 Amniotic Fluid
 AFI FV:      Within normal limits

 AFI Sum(cm)     %Tile       Largest Pocket(cm)
 16.1            58

 RUQ(cm)       RLQ(cm)       LUQ(cm)        LLQ(cm)

Biometry
 BPD:      71.8  mm     G. Age:  28w 6d         41  %    CI:        71.72   %    70 - 86
                                                         FL/HC:      20.0   %    19.6 -
 HC:      269.9  mm     G. Age:  29w 3d         39  %    HC/AC:      1.04        0.99 -
 AC:      259.4  mm     G. Age:  30w 1d         82  %    FL/BPD:     75.3   %    71 - 87
 FL:       54.1  mm     G. Age:  28w 4d         32  %    FL/AC:      20.9   %    20 - 24

 Est. FW:    6654  gm      3 lb 1 oz     66  %
OB History

 Gravidity:    1         Term:   0
Gestational Age

 LMP:           28w 5d        Date:  05/05/21                 EDD:   02/09/22
 U/S Today:     29w 2d                                        EDD:   02/05/22
 Best:          28w 5d     Det. By:  LMP  (05/05/21)          EDD:   02/09/22
Anatomy

 Cranium:               Appears normal         LVOT:                   Appears normal
 Cavum:                 Previously seen        Aortic Arch:            Previously seen
 Ventricles:            Appears normal         Ductal Arch:            Previously seen
 Choroid Plexus:        Previously seen        Diaphragm:              Appears normal
 Cerebellum:            Previously seen        Stomach:                Appears normal, left
                                                                       sided
 Posterior Fossa:       Previously seen        Abdomen:                Previously seen
 Nuchal Fold:           Not applicable (>20    Abdominal Wall:         Previously seen
                        wks GA)
 Face:                  Orbits and profile     Cord Vessels:           Previously seen
                        previously seen
 Lips:                  Appears normal         Kidneys:                Appear normal
 Palate:                Previously seen        Bladder:                Appears normal
 Thoracic:              Appears normal         Spine:                  Previously seen
 Heart:                 Appears normal         Upper Extremities:      Previously seen
                        (4CH, axis, and
                        situs)
 RVOT:                  Appears normal         Lower Extremities:      Previously seen

 Other:  Fetus appears to be female. Heels/feet, open hands/5th digits, nasal
         bone, lenses, VC, 3VV, 3VT previously seen.
Cervix Uterus Adnexa

 Cervix
 Not visualized (advanced GA >48wks)

 Uterus
 No abnormality visualized.

 Right Ovary
 No adnexal mass visualized.

 Left Ovary
 No adnexal mass visualized.
 Cul De Sac
 No free fluid seen.

 Adnexa
 No abnormality visualized.
Impression

 Type 2 diabetes.  Patient takes insulin for control.  Fetal
 echocardiography was reported as normal.

 Fetal growth is appropriate for gestational age .Amniotic fluid
 is normal and good fetal activity is seen .
Recommendations

 -An appointment was made for her to return in 4 weeks for
 fetal growth assessment.
 -Weekly BPP from 32 weeks gestation till delivery.
                 Gabino, Yoko

## 2023-01-22 ENCOUNTER — Other Ambulatory Visit (HOSPITAL_COMMUNITY): Payer: Self-pay

## 2023-02-02 ENCOUNTER — Other Ambulatory Visit (HOSPITAL_COMMUNITY): Payer: Self-pay

## 2023-02-05 ENCOUNTER — Other Ambulatory Visit (HOSPITAL_COMMUNITY): Payer: Self-pay

## 2023-02-05 ENCOUNTER — Ambulatory Visit (INDEPENDENT_AMBULATORY_CARE_PROVIDER_SITE_OTHER): Payer: Self-pay | Admitting: Surgical

## 2023-02-05 ENCOUNTER — Encounter: Payer: Self-pay | Admitting: Surgical

## 2023-02-05 DIAGNOSIS — Z719 Counseling, unspecified: Secondary | ICD-10-CM

## 2023-02-05 NOTE — Progress Notes (Signed)
   Referring Provider No referring provider defined for this encounter.   CC:  Chief Complaint  Patient presents with   Cosmetic Visit      Yadhira Heldreth is an 37 y.o. female.  HPI: Patient is a very pleasant 37 year old female here to discuss her lips and neck.  She reports she is interested in a "lip flip", lip filler and or kybella in her neck area.  She has not had any treatments to these areas in the past.  She would like to know pricing and is then going to think this over.   Physical Exam    04/11/2022   11:36 AM 03/14/2022    9:20 AM 02/17/2022    8:46 AM  Vitals with BMI  Height   '5\' 2"'$   Weight 185 lbs 185 lbs 14 oz 176 lbs  BMI  [id number]  Systolic [id number] [id number]  Diastolic 75 55 70  Pulse 65 65 77    General:  No acute distress,  Alert and oriented, Non-Toxic, Normal speech and affect   Assessment/Plan Patient is a very pleasant 37 year old female here to discuss cosmetic procedure options for her lips and fullness in her neck/chin area.  We discussed pricing for Juvderm glabella, Restylane Kysse and Kybella.   I discussed with the patient that we do not perform the lip flip here at this office, she was understanding.  She is going to think this over and will call us to schedule an appointment.  Carola Rhine Gaynel Schaafsma 02/05/2023, 8:39 AM

## 2023-02-08 DIAGNOSIS — F909 Attention-deficit hyperactivity disorder, unspecified type: Secondary | ICD-10-CM | POA: Diagnosis not present

## 2023-02-08 DIAGNOSIS — B3731 Acute candidiasis of vulva and vagina: Secondary | ICD-10-CM | POA: Diagnosis not present

## 2023-02-08 DIAGNOSIS — R030 Elevated blood-pressure reading, without diagnosis of hypertension: Secondary | ICD-10-CM | POA: Diagnosis not present

## 2023-02-08 DIAGNOSIS — F331 Major depressive disorder, recurrent, moderate: Secondary | ICD-10-CM | POA: Diagnosis not present

## 2023-02-08 DIAGNOSIS — E669 Obesity, unspecified: Secondary | ICD-10-CM | POA: Diagnosis not present

## 2023-02-08 DIAGNOSIS — E1165 Type 2 diabetes mellitus with hyperglycemia: Secondary | ICD-10-CM | POA: Diagnosis not present

## 2023-02-08 DIAGNOSIS — F410 Panic disorder [episodic paroxysmal anxiety] without agoraphobia: Secondary | ICD-10-CM | POA: Diagnosis not present

## 2023-02-09 ENCOUNTER — Other Ambulatory Visit (HOSPITAL_COMMUNITY): Payer: Self-pay

## 2023-02-09 ENCOUNTER — Other Ambulatory Visit: Payer: Self-pay

## 2023-02-09 MED ORDER — AMPHETAMINE-DEXTROAMPHETAMINE 10 MG PO TABS
10.0000 mg | ORAL_TABLET | Freq: Every day | ORAL | 0 refills | Status: DC
  Filled 2023-02-09: qty 90, 90d supply, fill #0

## 2023-02-27 ENCOUNTER — Other Ambulatory Visit (HOSPITAL_COMMUNITY): Payer: Self-pay

## 2023-02-27 DIAGNOSIS — E1142 Type 2 diabetes mellitus with diabetic polyneuropathy: Secondary | ICD-10-CM | POA: Diagnosis not present

## 2023-02-27 DIAGNOSIS — R030 Elevated blood-pressure reading, without diagnosis of hypertension: Secondary | ICD-10-CM | POA: Diagnosis not present

## 2023-02-27 DIAGNOSIS — E1165 Type 2 diabetes mellitus with hyperglycemia: Secondary | ICD-10-CM | POA: Diagnosis not present

## 2023-02-27 DIAGNOSIS — E669 Obesity, unspecified: Secondary | ICD-10-CM | POA: Diagnosis not present

## 2023-02-27 MED ORDER — GABAPENTIN 300 MG PO CAPS
300.0000 mg | ORAL_CAPSULE | Freq: Every day | ORAL | 0 refills | Status: DC
  Filled 2023-02-27: qty 90, 90d supply, fill #0

## 2023-02-27 MED ORDER — OZEMPIC (2 MG/DOSE) 8 MG/3ML ~~LOC~~ SOPN
2.0000 mg | PEN_INJECTOR | SUBCUTANEOUS | 0 refills | Status: DC
  Filled 2023-02-27 (×2): qty 3, 28d supply, fill #0

## 2023-02-28 ENCOUNTER — Other Ambulatory Visit: Payer: Self-pay

## 2023-03-30 DIAGNOSIS — R6882 Decreased libido: Secondary | ICD-10-CM | POA: Diagnosis not present

## 2023-03-30 DIAGNOSIS — E669 Obesity, unspecified: Secondary | ICD-10-CM | POA: Diagnosis not present

## 2023-03-30 DIAGNOSIS — F520 Hypoactive sexual desire disorder: Secondary | ICD-10-CM | POA: Diagnosis not present

## 2023-03-30 DIAGNOSIS — E1165 Type 2 diabetes mellitus with hyperglycemia: Secondary | ICD-10-CM | POA: Diagnosis not present

## 2023-03-30 DIAGNOSIS — R030 Elevated blood-pressure reading, without diagnosis of hypertension: Secondary | ICD-10-CM | POA: Diagnosis not present

## 2023-03-31 ENCOUNTER — Other Ambulatory Visit (HOSPITAL_COMMUNITY): Payer: Self-pay

## 2023-03-31 MED ORDER — OZEMPIC (2 MG/DOSE) 8 MG/3ML ~~LOC~~ SOPN
2.0000 mg | PEN_INJECTOR | SUBCUTANEOUS | 2 refills | Status: AC
Start: 2023-03-30 — End: ?
  Filled 2023-03-31: qty 9, 84d supply, fill #0
  Filled 2023-05-30 – 2023-07-10 (×2): qty 9, 84d supply, fill #1
  Filled 2023-10-11: qty 9, 84d supply, fill #2
  Filled 2024-01-10: qty 9, 84d supply, fill #3

## 2023-03-31 MED ORDER — ADDYI 100 MG PO TABS
100.0000 mg | ORAL_TABLET | Freq: Every day | ORAL | 1 refills | Status: DC
  Filled 2023-03-31: qty 30, 30d supply, fill #0

## 2023-04-02 ENCOUNTER — Other Ambulatory Visit (HOSPITAL_COMMUNITY): Payer: Self-pay

## 2023-04-02 ENCOUNTER — Other Ambulatory Visit: Payer: Self-pay

## 2023-04-02 MED ORDER — ADDYI 100 MG PO TABS
100.0000 mg | ORAL_TABLET | Freq: Every day | ORAL | 1 refills | Status: DC
  Filled 2023-04-02: qty 30, 30d supply, fill #0

## 2023-04-07 ENCOUNTER — Other Ambulatory Visit (HOSPITAL_COMMUNITY): Payer: Self-pay

## 2023-04-27 DIAGNOSIS — E1165 Type 2 diabetes mellitus with hyperglycemia: Secondary | ICD-10-CM | POA: Diagnosis not present

## 2023-04-27 DIAGNOSIS — F520 Hypoactive sexual desire disorder: Secondary | ICD-10-CM | POA: Diagnosis not present

## 2023-04-27 DIAGNOSIS — F410 Panic disorder [episodic paroxysmal anxiety] without agoraphobia: Secondary | ICD-10-CM | POA: Diagnosis not present

## 2023-04-27 DIAGNOSIS — E669 Obesity, unspecified: Secondary | ICD-10-CM | POA: Diagnosis not present

## 2023-04-27 DIAGNOSIS — E1142 Type 2 diabetes mellitus with diabetic polyneuropathy: Secondary | ICD-10-CM | POA: Diagnosis not present

## 2023-05-01 ENCOUNTER — Other Ambulatory Visit: Payer: Self-pay

## 2023-05-01 ENCOUNTER — Other Ambulatory Visit (HOSPITAL_COMMUNITY): Payer: Self-pay

## 2023-05-01 MED ORDER — FLUOXETINE HCL 40 MG PO CAPS
40.0000 mg | ORAL_CAPSULE | Freq: Every morning | ORAL | 0 refills | Status: DC
  Filled 2023-05-01: qty 90, 90d supply, fill #0

## 2023-05-01 MED ORDER — GABAPENTIN 300 MG PO CAPS
300.0000 mg | ORAL_CAPSULE | Freq: Every day | ORAL | 0 refills | Status: DC
  Filled 2023-05-01 – 2023-06-20 (×2): qty 90, 90d supply, fill #0

## 2023-05-12 ENCOUNTER — Other Ambulatory Visit (HOSPITAL_COMMUNITY): Payer: Self-pay

## 2023-05-30 ENCOUNTER — Other Ambulatory Visit (HOSPITAL_COMMUNITY): Payer: Self-pay

## 2023-06-06 ENCOUNTER — Other Ambulatory Visit (HOSPITAL_COMMUNITY): Payer: Self-pay

## 2023-06-08 ENCOUNTER — Other Ambulatory Visit: Payer: Self-pay

## 2023-06-08 ENCOUNTER — Other Ambulatory Visit (HOSPITAL_COMMUNITY): Payer: Self-pay

## 2023-06-08 MED ORDER — AMPHETAMINE-DEXTROAMPHETAMINE 10 MG PO TABS
10.0000 mg | ORAL_TABLET | Freq: Every day | ORAL | 0 refills | Status: DC
  Filled 2023-06-08: qty 90, 90d supply, fill #0

## 2023-06-16 ENCOUNTER — Other Ambulatory Visit (HOSPITAL_COMMUNITY): Payer: Self-pay

## 2023-06-20 ENCOUNTER — Other Ambulatory Visit: Payer: Self-pay

## 2023-07-10 ENCOUNTER — Other Ambulatory Visit (HOSPITAL_COMMUNITY): Payer: Self-pay

## 2023-07-16 DIAGNOSIS — F520 Hypoactive sexual desire disorder: Secondary | ICD-10-CM | POA: Diagnosis not present

## 2023-07-16 DIAGNOSIS — E669 Obesity, unspecified: Secondary | ICD-10-CM | POA: Diagnosis not present

## 2023-07-16 DIAGNOSIS — E1165 Type 2 diabetes mellitus with hyperglycemia: Secondary | ICD-10-CM | POA: Diagnosis not present

## 2023-07-16 DIAGNOSIS — E1142 Type 2 diabetes mellitus with diabetic polyneuropathy: Secondary | ICD-10-CM | POA: Diagnosis not present

## 2023-07-16 DIAGNOSIS — F410 Panic disorder [episodic paroxysmal anxiety] without agoraphobia: Secondary | ICD-10-CM | POA: Diagnosis not present

## 2023-07-31 ENCOUNTER — Other Ambulatory Visit (HOSPITAL_COMMUNITY): Payer: Self-pay

## 2023-07-31 ENCOUNTER — Other Ambulatory Visit: Payer: Self-pay

## 2023-07-31 DIAGNOSIS — E669 Obesity, unspecified: Secondary | ICD-10-CM | POA: Diagnosis not present

## 2023-07-31 DIAGNOSIS — F520 Hypoactive sexual desire disorder: Secondary | ICD-10-CM | POA: Diagnosis not present

## 2023-07-31 DIAGNOSIS — F988 Other specified behavioral and emotional disorders with onset usually occurring in childhood and adolescence: Secondary | ICD-10-CM | POA: Diagnosis not present

## 2023-07-31 DIAGNOSIS — E1165 Type 2 diabetes mellitus with hyperglycemia: Secondary | ICD-10-CM | POA: Diagnosis not present

## 2023-07-31 DIAGNOSIS — Z0001 Encounter for general adult medical examination with abnormal findings: Secondary | ICD-10-CM | POA: Diagnosis not present

## 2023-07-31 DIAGNOSIS — E1142 Type 2 diabetes mellitus with diabetic polyneuropathy: Secondary | ICD-10-CM | POA: Diagnosis not present

## 2023-07-31 DIAGNOSIS — Z124 Encounter for screening for malignant neoplasm of cervix: Secondary | ICD-10-CM | POA: Diagnosis not present

## 2023-07-31 DIAGNOSIS — Z23 Encounter for immunization: Secondary | ICD-10-CM | POA: Diagnosis not present

## 2023-07-31 DIAGNOSIS — F410 Panic disorder [episodic paroxysmal anxiety] without agoraphobia: Secondary | ICD-10-CM | POA: Diagnosis not present

## 2023-07-31 MED ORDER — FLUOXETINE HCL 40 MG PO CAPS
40.0000 mg | ORAL_CAPSULE | Freq: Every morning | ORAL | 1 refills | Status: DC
  Filled 2023-07-31 – 2023-10-11 (×2): qty 90, 90d supply, fill #0

## 2023-08-01 ENCOUNTER — Other Ambulatory Visit: Payer: Self-pay

## 2023-08-01 ENCOUNTER — Encounter: Payer: Self-pay | Admitting: Pharmacist

## 2023-08-07 ENCOUNTER — Other Ambulatory Visit: Payer: Self-pay

## 2023-09-13 DIAGNOSIS — R52 Pain, unspecified: Secondary | ICD-10-CM | POA: Diagnosis not present

## 2023-09-13 DIAGNOSIS — B349 Viral infection, unspecified: Secondary | ICD-10-CM | POA: Diagnosis not present

## 2023-10-11 ENCOUNTER — Other Ambulatory Visit: Payer: Self-pay

## 2023-10-11 ENCOUNTER — Other Ambulatory Visit (HOSPITAL_COMMUNITY): Payer: Self-pay

## 2023-10-17 ENCOUNTER — Other Ambulatory Visit (HOSPITAL_COMMUNITY): Payer: Self-pay

## 2023-10-17 MED ORDER — GABAPENTIN 300 MG PO CAPS
300.0000 mg | ORAL_CAPSULE | Freq: Every day | ORAL | 0 refills | Status: DC
  Filled 2023-11-06: qty 90, 90d supply, fill #0

## 2023-10-22 ENCOUNTER — Other Ambulatory Visit (HOSPITAL_COMMUNITY): Payer: Self-pay

## 2023-10-22 DIAGNOSIS — L600 Ingrowing nail: Secondary | ICD-10-CM | POA: Diagnosis not present

## 2023-10-22 MED ORDER — CEPHALEXIN 500 MG PO CAPS
500.0000 mg | ORAL_CAPSULE | Freq: Two times a day (BID) | ORAL | 0 refills | Status: DC
  Filled 2023-10-22 (×2): qty 14, 7d supply, fill #0

## 2023-10-23 ENCOUNTER — Other Ambulatory Visit: Payer: Self-pay

## 2023-10-23 ENCOUNTER — Encounter: Payer: Self-pay | Admitting: Pharmacist

## 2023-11-05 ENCOUNTER — Other Ambulatory Visit (HOSPITAL_COMMUNITY): Payer: Self-pay

## 2023-11-05 MED ORDER — FLUCONAZOLE 150 MG PO TABS
ORAL_TABLET | ORAL | 0 refills | Status: DC
  Filled 2023-11-05: qty 2, 2d supply, fill #0

## 2023-11-06 ENCOUNTER — Other Ambulatory Visit (HOSPITAL_COMMUNITY): Payer: Self-pay

## 2023-11-06 ENCOUNTER — Other Ambulatory Visit: Payer: Self-pay

## 2023-12-04 DIAGNOSIS — R112 Nausea with vomiting, unspecified: Secondary | ICD-10-CM | POA: Diagnosis not present

## 2023-12-04 DIAGNOSIS — E1142 Type 2 diabetes mellitus with diabetic polyneuropathy: Secondary | ICD-10-CM | POA: Diagnosis not present

## 2023-12-04 DIAGNOSIS — E1165 Type 2 diabetes mellitus with hyperglycemia: Secondary | ICD-10-CM | POA: Diagnosis not present

## 2023-12-04 DIAGNOSIS — F988 Other specified behavioral and emotional disorders with onset usually occurring in childhood and adolescence: Secondary | ICD-10-CM | POA: Diagnosis not present

## 2023-12-04 DIAGNOSIS — E669 Obesity, unspecified: Secondary | ICD-10-CM | POA: Diagnosis not present

## 2023-12-04 DIAGNOSIS — F410 Panic disorder [episodic paroxysmal anxiety] without agoraphobia: Secondary | ICD-10-CM | POA: Diagnosis not present

## 2023-12-04 DIAGNOSIS — F909 Attention-deficit hyperactivity disorder, unspecified type: Secondary | ICD-10-CM | POA: Diagnosis not present

## 2023-12-05 ENCOUNTER — Other Ambulatory Visit (HOSPITAL_COMMUNITY): Payer: Self-pay

## 2023-12-05 MED ORDER — AMPHETAMINE-DEXTROAMPHETAMINE 10 MG PO TABS
10.0000 mg | ORAL_TABLET | Freq: Every day | ORAL | 0 refills | Status: DC
  Filled 2023-12-05: qty 90, 90d supply, fill #0

## 2023-12-05 MED ORDER — ONDANSETRON 4 MG PO TBDP
4.0000 mg | ORAL_TABLET | Freq: Three times a day (TID) | ORAL | 0 refills | Status: AC | PRN
  Filled 2023-12-05: qty 30, 10d supply, fill #0

## 2023-12-05 MED ORDER — OZEMPIC (2 MG/DOSE) 8 MG/3ML ~~LOC~~ SOPN
2.0000 mg | PEN_INJECTOR | SUBCUTANEOUS | 0 refills | Status: DC
  Filled 2024-03-22: qty 9, 84d supply, fill #0

## 2023-12-06 ENCOUNTER — Other Ambulatory Visit: Payer: Self-pay

## 2023-12-06 ENCOUNTER — Other Ambulatory Visit (HOSPITAL_COMMUNITY): Payer: Self-pay

## 2023-12-07 DIAGNOSIS — S0501XA Injury of conjunctiva and corneal abrasion without foreign body, right eye, initial encounter: Secondary | ICD-10-CM | POA: Diagnosis not present

## 2023-12-10 DIAGNOSIS — S0501XD Injury of conjunctiva and corneal abrasion without foreign body, right eye, subsequent encounter: Secondary | ICD-10-CM | POA: Diagnosis not present

## 2024-01-11 ENCOUNTER — Other Ambulatory Visit (HOSPITAL_COMMUNITY): Payer: Self-pay

## 2024-01-11 ENCOUNTER — Other Ambulatory Visit: Payer: Self-pay

## 2024-03-22 ENCOUNTER — Other Ambulatory Visit (HOSPITAL_COMMUNITY): Payer: Self-pay

## 2024-03-22 ENCOUNTER — Other Ambulatory Visit (HOSPITAL_BASED_OUTPATIENT_CLINIC_OR_DEPARTMENT_OTHER): Payer: Self-pay

## 2024-03-24 ENCOUNTER — Other Ambulatory Visit: Payer: Self-pay

## 2024-04-03 ENCOUNTER — Other Ambulatory Visit (HOSPITAL_COMMUNITY): Payer: Self-pay

## 2024-04-03 ENCOUNTER — Other Ambulatory Visit: Payer: Self-pay

## 2024-04-03 MED ORDER — GABAPENTIN 300 MG PO CAPS
300.0000 mg | ORAL_CAPSULE | Freq: Every day | ORAL | 0 refills | Status: DC
  Filled 2024-04-03: qty 90, 90d supply, fill #0

## 2024-04-04 ENCOUNTER — Other Ambulatory Visit (HOSPITAL_COMMUNITY): Payer: Self-pay

## 2024-04-04 ENCOUNTER — Other Ambulatory Visit: Payer: Self-pay

## 2024-04-04 DIAGNOSIS — R112 Nausea with vomiting, unspecified: Secondary | ICD-10-CM | POA: Diagnosis not present

## 2024-04-04 DIAGNOSIS — E1142 Type 2 diabetes mellitus with diabetic polyneuropathy: Secondary | ICD-10-CM | POA: Diagnosis not present

## 2024-04-04 DIAGNOSIS — F909 Attention-deficit hyperactivity disorder, unspecified type: Secondary | ICD-10-CM | POA: Diagnosis not present

## 2024-04-04 DIAGNOSIS — E1165 Type 2 diabetes mellitus with hyperglycemia: Secondary | ICD-10-CM | POA: Diagnosis not present

## 2024-04-04 DIAGNOSIS — R42 Dizziness and giddiness: Secondary | ICD-10-CM | POA: Diagnosis not present

## 2024-04-04 DIAGNOSIS — F410 Panic disorder [episodic paroxysmal anxiety] without agoraphobia: Secondary | ICD-10-CM | POA: Diagnosis not present

## 2024-04-04 DIAGNOSIS — E669 Obesity, unspecified: Secondary | ICD-10-CM | POA: Diagnosis not present

## 2024-04-04 DIAGNOSIS — E785 Hyperlipidemia, unspecified: Secondary | ICD-10-CM | POA: Diagnosis not present

## 2024-04-04 DIAGNOSIS — R002 Palpitations: Secondary | ICD-10-CM | POA: Diagnosis not present

## 2024-04-04 MED ORDER — GABAPENTIN 300 MG PO CAPS: 300.0000 mg | ORAL_CAPSULE | Freq: Two times a day (BID) | ORAL | 0 refills | Status: DC

## 2024-04-04 MED ORDER — FLUOXETINE HCL 40 MG PO CAPS
40.0000 mg | ORAL_CAPSULE | Freq: Every morning | ORAL | 1 refills | Status: DC
Start: 2024-04-04 — End: 2024-07-11
  Filled 2024-04-04: qty 90, 90d supply, fill #0

## 2024-04-15 DIAGNOSIS — E1165 Type 2 diabetes mellitus with hyperglycemia: Secondary | ICD-10-CM | POA: Diagnosis not present

## 2024-04-15 DIAGNOSIS — R002 Palpitations: Secondary | ICD-10-CM | POA: Diagnosis not present

## 2024-04-15 DIAGNOSIS — E785 Hyperlipidemia, unspecified: Secondary | ICD-10-CM | POA: Diagnosis not present

## 2024-04-18 DIAGNOSIS — H18831 Recurrent erosion of cornea, right eye: Secondary | ICD-10-CM | POA: Diagnosis not present

## 2024-05-22 ENCOUNTER — Other Ambulatory Visit (HOSPITAL_COMMUNITY): Payer: Self-pay

## 2024-05-22 ENCOUNTER — Other Ambulatory Visit: Payer: Self-pay

## 2024-05-22 MED ORDER — ONDANSETRON 4 MG PO TBDP
4.0000 mg | ORAL_TABLET | Freq: Three times a day (TID) | ORAL | 0 refills | Status: DC | PRN
  Filled 2024-05-22: qty 30, 10d supply, fill #0

## 2024-07-11 ENCOUNTER — Other Ambulatory Visit (HOSPITAL_COMMUNITY): Payer: Self-pay

## 2024-07-11 ENCOUNTER — Ambulatory Visit: Attending: Physician Assistant | Admitting: Physician Assistant

## 2024-07-11 ENCOUNTER — Encounter: Payer: Self-pay | Admitting: Physician Assistant

## 2024-07-11 ENCOUNTER — Other Ambulatory Visit: Payer: Self-pay

## 2024-07-11 ENCOUNTER — Other Ambulatory Visit (INDEPENDENT_AMBULATORY_CARE_PROVIDER_SITE_OTHER)

## 2024-07-11 VITALS — BP 110/78 | HR 114 | Ht 62.0 in | Wt 142.0 lb

## 2024-07-11 DIAGNOSIS — R002 Palpitations: Secondary | ICD-10-CM

## 2024-07-11 DIAGNOSIS — R55 Syncope and collapse: Secondary | ICD-10-CM

## 2024-07-11 MED ORDER — GABAPENTIN 300 MG PO CAPS
300.0000 mg | ORAL_CAPSULE | Freq: Two times a day (BID) | ORAL | 0 refills | Status: DC
  Filled 2024-07-12: qty 180, 90d supply, fill #0
  Filled ????-??-??: fill #0

## 2024-07-11 MED ORDER — DILTIAZEM HCL 30 MG PO TABS
30.0000 mg | ORAL_TABLET | ORAL | 0 refills | Status: AC | PRN
  Filled 2024-07-11: qty 30, 30d supply, fill #0

## 2024-07-11 MED ORDER — OZEMPIC (1 MG/DOSE) 4 MG/3ML ~~LOC~~ SOPN
1.0000 mg | PEN_INJECTOR | SUBCUTANEOUS | 0 refills | Status: AC
  Filled 2024-07-11: qty 3, 28d supply, fill #0

## 2024-07-11 MED ORDER — FLUOXETINE HCL 40 MG PO CAPS
40.0000 mg | ORAL_CAPSULE | Freq: Every morning | ORAL | 0 refills | Status: DC
  Filled 2024-07-11: qty 90, 90d supply, fill #0

## 2024-07-11 NOTE — Patient Instructions (Signed)
 Medication Instructions:  START TAKING DILTIAZEM  30 MG AS NEEDED.  Lab Work: NONE TO BE DONE TODAY.  Testing/Procedures: Sherry Campbell- Long Term Monitor Instructions  Your physician has requested you wear a ZIO patch monitor for 14 days.  This is a single patch monitor. [employer] supplies one patch monitor per enrollment. Additional stickers are not available. Please do not apply patch if you will be having a Nuclear Stress Test,  Echocardiogram, Cardiac CT, MRI, or Chest Xray during the period you would be wearing the  monitor. The patch cannot be worn during these tests. You cannot remove and re-apply the  ZIO XT patch monitor.  Your ZIO patch monitor will be mailed 3 day USPS to your address on file. It may take 3-5 days  to receive your monitor after you have been enrolled.  Once you have received your monitor, please review the enclosed instructions. Your monitor  has already been registered assigning a specific monitor serial # to you.  Billing and Patient Assistance Program Information  We have supplied [employer] with any of your insurance information on file for billing purposes. [employer] offers a sliding scale Patient Assistance Program for patients that do not have  insurance, or whose insurance does not completely cover the cost of the ZIO monitor.  You must apply for the Patient Assistance Program to qualify for this discounted rate.  To apply, please call [employer] at [phone], select option 4, select option 2, ask to apply for  Patient Assistance Program. Sherry Campbell will ask your household income, and how many people  are in your household. They will quote your out-of-pocket cost based on that information.  [employer] will also be able to set up a 38-month, interest-free payment plan if needed.  Applying the monitor   Shave hair from upper left chest.  Hold abrader disc by orange tab. Rub abrader in 40 strokes over the upper left chest as  indicated in your monitor instructions.   Clean area with 4 enclosed alcohol pads. Let dry.  Apply patch as indicated in monitor instructions. Patch will be placed under collarbone on left  side of chest with arrow pointing upward.  Rub patch adhesive wings for 2 minutes. Remove white label marked 1. Remove the white  label marked 2. Rub patch adhesive wings for 2 additional minutes.  While looking in a mirror, press and release button in center of patch. A small green light will  flash 3-4 times. This will be your only indicator that the monitor has been turned on.  Do not shower for the first 24 hours. You may shower after the first 24 hours.  Press the button if you feel a symptom. You will hear a small click. Record Date, Time and  Symptom in the Patient Logbook.  When you are ready to remove the patch, follow instructions on the last 2 pages of Patient  Logbook. Stick patch monitor onto the last page of Patient Logbook.  Place Patient Logbook in the blue and white box. Use locking tab on box and tape box closed  securely. The blue and white box has prepaid postage on it. Please place it in the mailbox as  soon as possible. Your physician should have your test results approximately 7 days after the  monitor has been mailed back to [facility].  Call [facility] Customer Care at [phone] if you have questions regarding  your ZIO XT patch monitor. Call them immediately if you see an orange light blinking on your  monitor.  If your monitor falls off in less than 4 days, contact our Monitor department at [phone].  If your monitor becomes loose or falls off after 4 days call [employer] at [phone] for  suggestions on securing your monitor   Follow-Up: At [facility], you and your health needs are our priority.  As part of our continuing mission to provide you with exceptional heart care, our providers are all part of one team.  This team includes your primary Cardiologist (physician) and Advanced  Practice Providers or APPs (Physician Assistants and Nurse Practitioners) who all work together to provide you with the care you need, when you need it.  Your next appointment:   4 WEEKS WITH EP  Provider:   HAO MENG, PA

## 2024-07-11 NOTE — Progress Notes (Signed)
 Cardiology Office Note   Date:  07/13/2024  ID:  Sherry Campbell, DOB 1986/11/12, MRN [id number] PCP: [name] MATSU, NP  [facility] Providers Cardiologist:  [facility] - DOD Dr. Verlin  History of Present Illness Sherry Campbell is a 38 y.o. female with PMH of anxiety, ADHD, DM2, hyperlipidemia, obesity, syncope.  Patient was seen 1 time in August 2016 by [name] for evaluation of syncope.  Prior to that, she has 3 syncopal episode.  First occurred when she was in high school.  She was outside working with [employer] and was standing in the sun.  She had a sudden onset of diaphoresis, flushing sensation and dizziness.  Her last episode was standing in line at [place], similar description as above.  Symptom was attributed to vagally mediated episodes.  Echocardiogram was recommended, however was never done.  She was established with PCP at [facility] in March 2019.  She was complaining of palpitation at the time.  Zio patch was ordered which showed short runs of ventricular ectopy.  She had 195 events on her Zio patch.  She was seen by [facility] EP service on 05/01/2018.  Per EP note, she had 195 episode of accelerated idioventricular rhythm.  Episodes of arrhythmia started off by a fusion beat and subsequent ventricular beats of consistent morphology.  Longest recorded episode lasted 1 hour and 5 minutes.  There were several patient triggered events of which occurred during sinus rhythm.  EP service recommended a stress echocardiogram to see if the rhythm can be provoked and captured on twelve-lead EKG to localize his origin.  This will also allow them to assess for structural heart disease.  If rhythm proved to be easily provoked, EP service was thinking about ablation.  Stress echocardiogram performed on 05/22/2018 showed the patient was able to achieve 11 METS of activity, during pretest, EKG showed occasional salvos of wide-complex beats with shortening of PR interval consistent  with consecutive PVCs or accessory pathway.  No ST changes was seen during stress.  She only had occasional PVCs during stress portion.  Overall LV function was normal before and after stress, however due to poor quality image, cannot assess regional wall motion abnormality after exercise.  Since symptom has improved, she opted not to proceed with flecainide and ablation.  Hemoglobin A1c in 2022 was 10.4, since then her hemoglobin A1c has came down to around 6.  She is on fluoxetine  for her anxiety.  Despite her young age, she is already having burning sensation in her bilateral feet consistent with peripheral neuropathy secondary to diabetes.  She has been treated with gabapentin .   Patient presents today for follow-up.  She has been having intermittent palpitation for the past several weeks.  She has a IUD in place and therefore very unlikely to have another pregnancy.  Her palpitation would last about 5 to 10 minutes and would occur about 2-3 episodes per week.  During that episode, her heart rate will go up to 150 bpm and sometimes her blood pressure will drop down to the 70s.  She has a Scientist, physiological, I was able to review one of the strip, it looks more like SVT with narrow QRS and idioventricular rhythm that was reported from 2019.  I curbside the EP service, will order heart monitor to assess if she has multiple different type of rhythm going home.  I will give her some short acting as needed dose of diltiazem  for palpitation.  During the meantime, we will place  a ZIO AT monitor on the patient and refer her to EP service to be seen in 4 weeks.  Depend on the result of the heart monitor, she may ended up needing EP study.  She can follow-up with the EP service from this point forth  ROS:   Patient denies any chest pain but complains of palpitation and presyncope.  She has no lower extremity edema, orthopnea or PND.  Studies Reviewed EKG Interpretation Date/Time:  Friday July 11 2024 15:19:21  EDT Ventricular Rate:  114 PR Interval:  140 QRS Duration:  76 QT Interval:  290 QTC Calculation: 399 R Axis:   -55  Text Interpretation: Sinus tachycardia Possible Left atrial enlargement Left anterior fascicular block Possible Anterior infarct , age undetermined No previous ECGs available Confirmed by [name] [phone]) on 07/13/2024 8:07:36 PM        Risk Assessment/Calculations          Physical Exam VS:  BP 110/78 (BP Location: Right Arm, Patient Position: Sitting, Cuff Size: Normal)   Pulse (!) 114   Ht 5' 2 (1.575 m)   Wt 142 lb (64.4 kg)   BMI 25.97 kg/m        Wt Readings from Last 3 Encounters:  07/11/24 142 lb (64.4 kg)  04/11/22 185 lb (83.9 kg)  03/14/22 185 lb 14.4 oz (84.3 kg)    GEN: Well nourished, well developed in no acute distress NECK: No JVD; No carotid bruits CARDIAC: RRR, no murmurs, rubs, gallops RESPIRATORY:  Clear to auscultation without rales, wheezing or rhonchi  ABDOMEN: Soft, non-tender, non-distended EXTREMITIES:  No edema; No deformity   ASSESSMENT AND PLAN  Palpitation: She underwent extensive workup in 2019 at [facility] by Dr. Katina of EP service.  Heart monitor at the time showed 195 episodes of accelerated idioventricular rhythm with a widened QRS.  She also underwent stress echocardiogram to see if the underlying arrhythmia can be brought on with exertion.  She was offered ablation versus flecainide, however since her symptom subsided by itself, she opted to continue observation.  In the past few weeks, symptom has recurred again.  She has a Scientist, physiological, based on tracings from her Apple Watch, she actually do not have accelerated idioventricular rhythm, instead at this time she has what appears to be SVT with narrow QRS.  I curb sided our EP colleague, we will proceed with a heart monitor and start her on a as needed dose of 30 mg diltiazem .  She has a tendency to develop presyncope during the episode as her blood pressure will dip  down to the 70s when her heart rate did jump up to 150s.  Depend on the result from the heart monitor, she may ended up needing EP study.  We will refer her to electrophysiology service to be seen in 4 weeks by which time heart monitor should be back.  Presyncope: Only time she has presyncope is when her heart rate go up to 150s and her blood pressure dropped down to 70s.       Dispo: Follow-up with the EP service in 4 weeks  Signed, Dimitrios Balestrieri, [place]

## 2024-07-11 NOTE — Progress Notes (Unsigned)
 Applied a 14 day Zio AT monitor to patient in the office  [place] to read

## 2024-07-12 ENCOUNTER — Other Ambulatory Visit (HOSPITAL_COMMUNITY): Payer: Self-pay

## 2024-07-12 DIAGNOSIS — R002 Palpitations: Secondary | ICD-10-CM | POA: Diagnosis not present

## 2024-07-12 MED ORDER — AMPHETAMINE-DEXTROAMPHETAMINE 10 MG PO TABS
10.0000 mg | ORAL_TABLET | Freq: Every day | ORAL | 0 refills | Status: DC
  Filled 2024-07-12: qty 90, 90d supply, fill #0

## 2024-07-14 ENCOUNTER — Other Ambulatory Visit: Payer: Self-pay

## 2024-07-18 ENCOUNTER — Other Ambulatory Visit (HOSPITAL_COMMUNITY): Payer: Self-pay

## 2024-07-23 ENCOUNTER — Other Ambulatory Visit (HOSPITAL_COMMUNITY): Payer: Self-pay

## 2024-07-23 MED ORDER — GABAPENTIN 300 MG PO CAPS
300.0000 mg | ORAL_CAPSULE | Freq: Two times a day (BID) | ORAL | 0 refills | Status: AC
  Filled 2024-10-09 – 2024-10-11 (×2): qty 180, 90d supply, fill #0

## 2024-07-24 ENCOUNTER — Other Ambulatory Visit (HOSPITAL_COMMUNITY): Payer: Self-pay

## 2024-08-01 ENCOUNTER — Ambulatory Visit: Admitting: Cardiology

## 2024-08-21 NOTE — Progress Notes (Deleted)
  Electrophysiology Office Note:    Date:  08/21/2024   ID:  Sherry Campbell, DOB 09-Aug-1986, MRN [id number]  PCP:  [name] MATSU, NP   [facility] Providers Cardiologist:  None { Click to update primary MD,subspecialty MD or APP then REFRESH:1}    Referring MD: [name], PA   History of Present Illness:    Sherry Campbell is a 38 y.o. female with a medical history significant for tachycardia, syncope, ADHD, referred for evaluation of tachycardia and syncope.     She has had recurrence of syncope over the years for seen by [name] in 2016 and by Dr. Katina at [facility] in 2019. Episodes began when she was in high school.  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  She had also noted palpitations periodically.  Zio patch was placed that showed frequent ventricular ectopy including episodes of accelerated idioventricular rhythm, the longest lasted over an hour. The had a stress echo with normal structure/function, and occasional PVCs and NSVT were induced with exercise.  Discussed the use of AI scribe software for clinical note transcription with the patient, who gave verbal consent to proceed.  History of Present Illness          Today, ***  EKGs/Labs/Other Studies Reviewed Today:     Echocardiogram:  *** ***   Monitors:  *** day monitor ***  -- my interpretation ***  Stress testing:  *** ***  Advanced imaging:  *** ***  Cardiac catherization  *** ***  EKG:         Physical Exam:    VS:  There were no vitals taken for this visit.    Wt Readings from Last 3 Encounters:  07/11/24 142 lb (64.4 kg)  04/11/22 185 lb (83.9 kg)  03/14/22 185 lb 14.4 oz (84.3 kg)     GEN: *** Well nourished, well developed in no acute distress CARDIAC: ***RRR, no murmurs, rubs, gallops RESPIRATORY:  Normal work of breathing MUSCULOSKELETAL: *** edema    ASSESSMENT & PLAN:      *** ***  *** ***  *** ***  *** ***  *** ***    Signed, Eulas FORBES Furbish, MD  08/21/2024 2:04 PM     [facility]

## 2024-08-22 ENCOUNTER — Ambulatory Visit: Admitting: Cardiovascular Disease

## 2024-08-26 DIAGNOSIS — R002 Palpitations: Secondary | ICD-10-CM | POA: Diagnosis not present

## 2024-08-29 ENCOUNTER — Ambulatory Visit: Payer: Self-pay | Admitting: Physician Assistant

## 2024-08-29 NOTE — Progress Notes (Signed)
 Patient never viewed in mychart. Printed, highlighted doctor comments and mailed to patient

## 2024-09-09 NOTE — Progress Notes (Deleted)
  Electrophysiology Office Note:    Date:  09/09/2024   ID:  Sherry Campbell, DOB 05-17-1986, MRN [id number]  PCP:  [name] MATSU, NP   [facility] Providers Cardiologist:  None { Click to update primary MD,subspecialty MD or APP then REFRESH:1}    Referring MD: [name], PA   History of Present Illness:    Sherry Campbell is a 38 y.o. female with a medical history significant for AIVR, ADHD, type 2 diabetes hyperlipidemia, obesity referred for arrhythmia management.     She was seen by our group in 2016 by [name] after recurrent episodes of syncope.  These were attributed to dehydration, orthostasis, neurocardiogenic --nonarrhythmic--causes   She has a history of palpitations and underwent evaluation by Dr. Katina of [facility] in 2019.  Her monitor showed short ventricular runs and ventricular ectopy.  1 episode lasted an hour and 5 minutes.  A stress echocardiogram was performed.  She achieved 11 METS.  PVCs and short runs were captured prior to stress but seem to be suppressed by stress.    She was offered flecainide versus EP study and possible ablation but she decided to defer and monitor since her symptoms were improving.  She has had recurrence of palpitations and presented to [facility] for [facility].  He wears on Apple watch; tracings during palpitations showed episodes of narrow complex tachycardia.  A ZIO monitor was placed but only showed 1 brief run most likely an atrial tachycardia.  Discussed the use of AI scribe software for clinical note transcription with the patient, who gave verbal consent to proceed.  History of Present Illness          Today, ***  EKGs/Labs/Other Studies Reviewed Today:     Echocardiogram:  *** ***   Monitors:  13 day monitor August 2025-- my interpretation Sinus rhythm, heart rate 47-151 BPM, average 83 bpm There is 1 episode of atrial tachycardia lasting 6 beats during the monitoring  period  Stress testing:  *** ***  Advanced imaging:  *** ***  Cardiac catherization  *** ***  EKG:         Physical Exam:    VS:  There were no vitals taken for this visit.    Wt Readings from Last 3 Encounters:  07/11/24 142 lb (64.4 kg)  04/11/22 185 lb (83.9 kg)  03/14/22 185 lb 14.4 oz (84.3 kg)     GEN: *** Well nourished, well developed in no acute distress CARDIAC: ***RRR, no murmurs, rubs, gallops RESPIRATORY:  Normal work of breathing MUSCULOSKELETAL: *** edema    ASSESSMENT & PLAN:     Palpitations ***  *** ***  History of AIVR Evaluated at [facility] in 2019 No intervention pursued.  Symptoms resolved  *** ***  *** ***    Signed, Eulas FORBES Furbish, MD  09/09/2024 5:52 PM     [facility]

## 2024-09-10 ENCOUNTER — Ambulatory Visit: Attending: Cardiology | Admitting: Cardiovascular Disease

## 2024-10-09 ENCOUNTER — Other Ambulatory Visit (HOSPITAL_COMMUNITY): Payer: Self-pay

## 2024-10-09 MED ORDER — FLUOXETINE HCL 40 MG PO CAPS
40.0000 mg | ORAL_CAPSULE | Freq: Every morning | ORAL | 0 refills | Status: AC
  Filled 2024-10-09: qty 90, 90d supply, fill #0

## 2024-10-10 ENCOUNTER — Other Ambulatory Visit (HOSPITAL_COMMUNITY): Payer: Self-pay

## 2024-10-10 MED ORDER — AMPHETAMINE-DEXTROAMPHETAMINE 10 MG PO TABS
10.0000 mg | ORAL_TABLET | Freq: Every day | ORAL | 0 refills | Status: AC
  Filled 2024-10-10: qty 90, 90d supply, fill #0

## 2024-10-11 ENCOUNTER — Other Ambulatory Visit (HOSPITAL_COMMUNITY): Payer: Self-pay

## 2024-11-19 ENCOUNTER — Encounter: Payer: Self-pay | Admitting: Obstetrics and Gynecology

## 2024-11-19 ENCOUNTER — Ambulatory Visit: Payer: Self-pay | Admitting: Obstetrics and Gynecology

## 2024-11-19 ENCOUNTER — Other Ambulatory Visit (HOSPITAL_COMMUNITY)
Admission: RE | Admit: 2024-11-19 | Discharge: 2024-11-19 | Disposition: A | Discharge status: Home / Self Care | Payer: Self-pay | Source: Ambulatory Visit | Attending: Obstetrics and Gynecology | Admitting: Obstetrics and Gynecology

## 2024-11-19 ENCOUNTER — Other Ambulatory Visit: Payer: Self-pay

## 2024-11-19 VITALS — BP 113/64 | HR 76 | Ht 62.0 in | Wt 154.3 lb

## 2024-11-19 DIAGNOSIS — Z01419 Encounter for gynecological examination (general) (routine) without abnormal findings: Secondary | ICD-10-CM | POA: Insufficient documentation

## 2024-11-19 DIAGNOSIS — Z1331 Encounter for screening for depression: Secondary | ICD-10-CM

## 2024-11-19 DIAGNOSIS — Z30432 Encounter for removal of intrauterine contraceptive device: Secondary | ICD-10-CM

## 2024-11-19 NOTE — Addendum Note (Signed)
 Addended by: ROSALYNN SHARLET RAMAN on: 11/19/2024 10:31 AM   Modules accepted: Orders

## 2024-11-19 NOTE — Progress Notes (Signed)
° ° °  GYNECOLOGY OFFICE PROCEDURE NOTE  Sherry Campbell is a 38 y.o. G1P1001 here for  IUD removal. No GYN concerns.  Last pap smear was on 5/22 and was normal.   Pap will be done today.  IUD Removal  Patient identified, informed consent performed, consent signed.  Patient was in the dorsal lithotomy position, normal external genitalia was noted.  A speculum was placed in the patient's vagina, normal discharge was noted, no lesions.  Pap smear done prior to IUD removal. The cervix was visualized, no lesions, no abnormal discharge.  The strings of the IUD were grasped and pulled using ring forceps. The IUD was removed in its entirety. Patient tolerated the procedure well.    Patient plans for pregnancy soon and she was told to avoid teratogens, take PNV and folic acid.  She is a type 2 diabetic and has stopped her ozempic  3 months ago.  She is controlling blood sugars with diet currently.  A1c was 5.2 (?)Routine preventative health maintenance measures emphasized.   Jerilynn Buddle, MD, FACOG Obstetrician & Gynecologist, [facility] for [facility], [facility]

## 2024-11-24 LAB — CYTOLOGY - PAP
Chlamydia: NEGATIVE
Comment: NEGATIVE
Comment: NEGATIVE
Comment: NORMAL
Diagnosis: UNDETERMINED — AB
High risk HPV: POSITIVE — AB
Neisseria Gonorrhea: NEGATIVE

## 2024-11-26 ENCOUNTER — Ambulatory Visit: Payer: Self-pay | Admitting: Obstetrics and Gynecology

## 2024-11-28 NOTE — Telephone Encounter (Addendum)
 Attempted to call pt regarding pap results and need for colpo, voicemail box not set up.   Waddell, RN    ----- Message from Jerilynn Buddle, MD sent at 11/26/2024  2:28 AM EST ----- ASCUS pap, recommend colpo

## 2024-12-01 NOTE — Telephone Encounter (Signed)
-----   Message from Jerilynn Buddle, MD sent at 11/26/2024  2:28 AM EST ----- ASCUS pap, recommend colpo

## 2024-12-01 NOTE — Telephone Encounter (Signed)
 Informed pt of abnormal pap smear results and provider's recommendation of need for colposcopy.  Pt verbalized understanding of need for colpo procedure, we discussed process.  She had no further questions at this time, I let her know that our admin staff will reach  out for scheduling.    Waddell, RN
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
## 2024-12-09 DIAGNOSIS — F331 Major depressive disorder, recurrent, moderate: Secondary | ICD-10-CM

## 2024-12-09 DIAGNOSIS — Z658 Other specified problems related to psychosocial circumstances: Secondary | ICD-10-CM

## 2024-12-09 DIAGNOSIS — F988 Other specified behavioral and emotional disorders with onset usually occurring in childhood and adolescence: Secondary | ICD-10-CM

## 2024-12-09 NOTE — Patient Instructions (Signed)
 Center for [facility] Healthcare at [facility] for Women [address] [place], [place] [address] [phone] (main office) [phone] (Madeleine Fenn's office)  [facility] [email or web address]   [facility]  [address] [place], [place] [address] [phone] or [phone] [facility] 24/7 FOR ANYONE [address] [place], [place]  [phone] Fax: [phone] [email or web address] *Interpreters available *Accepts all insurance and uninsured for Urgent Care needs *Accepts Medicaid and uninsured for outpatient treatment (below)    ONLY FOR [facility]  Below:   Outpatient New Patient Assessment/Therapy Walk-ins:        Monday -Thursday 8am until slots are full.        Every Friday 1pm-4pm  (first come, first served)                   New Patient Psychiatry/Medication Management        Monday-Friday 8am-11am (first come, first served)              For all walk-ins we ask that you arrive by 7:15am, because patients will be seen in the order of arrival.    [employer] and Websites Here are a few free apps meant to help you to help yourself.  To find, try searching on the internet to see if the app is offered on Apple/Android devices. If your first choice doesn't come up on your device, the good news is that there are many choices! Play around with different apps to see which ones are helpful to you.    Calm This is an app meant to help increase calm feelings. Includes info, strategies, and tools for tracking your feelings.      Calm Harm  This app is meant to help with self-harm. Provides many 5-minute or 15-min coping strategies for doing instead of hurting yourself.       Healthy Minds Health Minds is a problem-solving tool to help deal with emotions and cope with stress you encounter wherever you are.      MindShift This app can help people cope with anxiety. Rather than trying to avoid  anxiety, you can make an important shift and face it.      MY3  MY3 features a support system, safety plan and resources with the goal of offering a tool to use in a time of need.       My Life My Voice  This mood journal offers a simple solution for tracking your thoughts, feelings and moods. Animated emoticons can help identify your mood.       Relax Melodies Designed to help with sleep, on this app you can mix sounds and meditations for relaxation.      Smiling Mind Smiling Mind is meditation made easy: it's a simple tool that helps put a smile on your mind.        Stop, Breathe & Think  A friendly, simple guide for people through meditations for mindfulness and compassion.  Stop, Breathe and Think Kids Enter your current feelings and choose a mission to help you cope. Offers videos for certain moods instead of just sound recordings.       Team Orange The goal of this tool is to help teens change how they think, act, and react. This app helps you focus on your own good feelings and experiences.      The [employer] Box The [employer] Box (VHB) contains simple tools to help patients with coping, relaxation, distraction, and positive thinking.

## 2024-12-12 NOTE — BH Specialist Note (Signed)
"   Pt did not arrive to video visit and did not answer the phone; lefthi ; left MyChart message for patient.    "

## 2024-12-23 DIAGNOSIS — F988 Other specified behavioral and emotional disorders with onset usually occurring in childhood and adolescence: Secondary | ICD-10-CM

## 2024-12-23 DIAGNOSIS — Z91199 Patient's noncompliance with other medical treatment and regimen due to unspecified reason: Secondary | ICD-10-CM

## 2024-12-23 DIAGNOSIS — F331 Major depressive disorder, recurrent, moderate: Secondary | ICD-10-CM

## 2025-01-15 ENCOUNTER — Ambulatory Visit: Payer: Self-pay | Admitting: Obstetrics and Gynecology

## 2025-01-26 ENCOUNTER — Ambulatory Visit: Payer: Self-pay | Admitting: Obstetrics and Gynecology
